# Patient Record
Sex: Female | Born: 1970 | Race: Black or African American | Hispanic: No | Marital: Married | Smoking: Never smoker
Health system: Southern US, Community
[De-identification: ages and names within clinical notes are randomized; demographics above are authoritative.]

## PROBLEM LIST (undated history)

## (undated) DIAGNOSIS — Z973 Presence of spectacles and contact lenses: Secondary | ICD-10-CM

## (undated) DIAGNOSIS — T8859XA Other complications of anesthesia, initial encounter: Secondary | ICD-10-CM

## (undated) DIAGNOSIS — Z800 Family history of malignant neoplasm of digestive organs: Secondary | ICD-10-CM

## (undated) DIAGNOSIS — F419 Anxiety disorder, unspecified: Secondary | ICD-10-CM

## (undated) DIAGNOSIS — F329 Major depressive disorder, single episode, unspecified: Secondary | ICD-10-CM

## (undated) DIAGNOSIS — E785 Hyperlipidemia, unspecified: Secondary | ICD-10-CM

## (undated) DIAGNOSIS — F32A Depression, unspecified: Secondary | ICD-10-CM

## (undated) DIAGNOSIS — I10 Essential (primary) hypertension: Secondary | ICD-10-CM

## (undated) HISTORY — DX: Depression, unspecified: F32.A

## (undated) HISTORY — DX: Anxiety disorder, unspecified: F41.9

## (undated) HISTORY — DX: Hyperlipidemia, unspecified: E78.5

## (undated) HISTORY — DX: Essential (primary) hypertension: I10

## (undated) HISTORY — DX: Major depressive disorder, single episode, unspecified: F32.9

---

## 2005-07-08 ENCOUNTER — Encounter: Admission: RE | Admit: 2005-07-08 | Discharge: 2005-07-08 | Payer: Self-pay | Admitting: Occupational Medicine

## 2005-11-10 DIAGNOSIS — O24419 Gestational diabetes mellitus in pregnancy, unspecified control: Secondary | ICD-10-CM

## 2005-11-10 HISTORY — DX: Gestational diabetes mellitus in pregnancy, unspecified control: O24.419

## 2006-01-12 ENCOUNTER — Encounter: Admission: RE | Admit: 2006-01-12 | Discharge: 2006-01-12 | Payer: Self-pay | Admitting: Obstetrics and Gynecology

## 2006-02-04 ENCOUNTER — Inpatient Hospital Stay (HOSPITAL_COMMUNITY): Admission: AD | Admit: 2006-02-04 | Discharge: 2006-02-04 | Payer: Self-pay | Admitting: Obstetrics and Gynecology

## 2006-03-22 ENCOUNTER — Inpatient Hospital Stay (HOSPITAL_COMMUNITY): Admission: AD | Admit: 2006-03-22 | Discharge: 2006-03-25 | Payer: Self-pay | Admitting: Obstetrics and Gynecology

## 2007-11-11 DIAGNOSIS — E079 Disorder of thyroid, unspecified: Secondary | ICD-10-CM

## 2007-11-11 HISTORY — PX: THYROIDECTOMY, PARTIAL: SHX18

## 2007-11-11 HISTORY — DX: Disorder of thyroid, unspecified: E07.9

## 2007-12-02 ENCOUNTER — Encounter (INDEPENDENT_AMBULATORY_CARE_PROVIDER_SITE_OTHER): Payer: Self-pay | Admitting: Diagnostic Radiology

## 2007-12-02 ENCOUNTER — Ambulatory Visit (HOSPITAL_COMMUNITY): Admission: RE | Admit: 2007-12-02 | Discharge: 2007-12-02 | Payer: Self-pay | Admitting: Otolaryngology

## 2008-02-10 ENCOUNTER — Encounter (INDEPENDENT_AMBULATORY_CARE_PROVIDER_SITE_OTHER): Payer: Self-pay | Admitting: Otolaryngology

## 2008-02-10 ENCOUNTER — Ambulatory Visit (HOSPITAL_COMMUNITY): Admission: RE | Admit: 2008-02-10 | Discharge: 2008-02-11 | Payer: Self-pay | Admitting: Otolaryngology

## 2008-04-02 IMAGING — US US BIOPSY
1 series · 14 of 16 positions shown · non-contrast
Comparison: none

CLINICAL DATA: 36-year-old female with complex nodule within the right thyroid.  Fine needle aspiration has been requested.
ULTRASOUND GUIDED FINE NEEDLE ASPIRATION RIGHT THYROID:
Written informed consent was obtained from the patient prior to the procedure.
Ultrasound was then performed to localize and mark an adequate site for aspiration.  The patient was then prepped and draped in a normal sterile fashion, 1% lidocaine was used for local anesthesia.  Using direct ultrasound guidance, a total of five passes were made using a 25-gauge hypodermic needle into multiple areas of this complex nodule.  Two of the aspirates yielded approximately 8 cc of thin brownish/green material from the cystic component.  Post-procedure imaging demonstrated no hematoma or immediate complication.  The patient tolerated the procedure well.

[Series 1: unknown · 0.09mm/px · 14 of 16 slices shown]
[im 1/16]
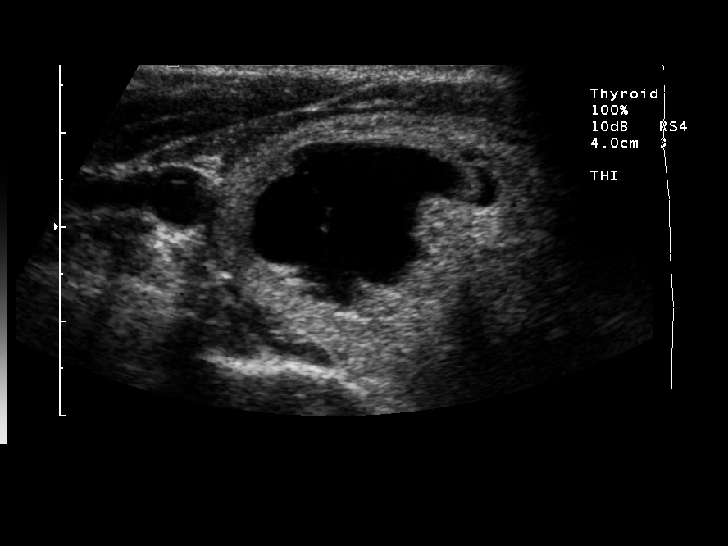
[im 2/16]
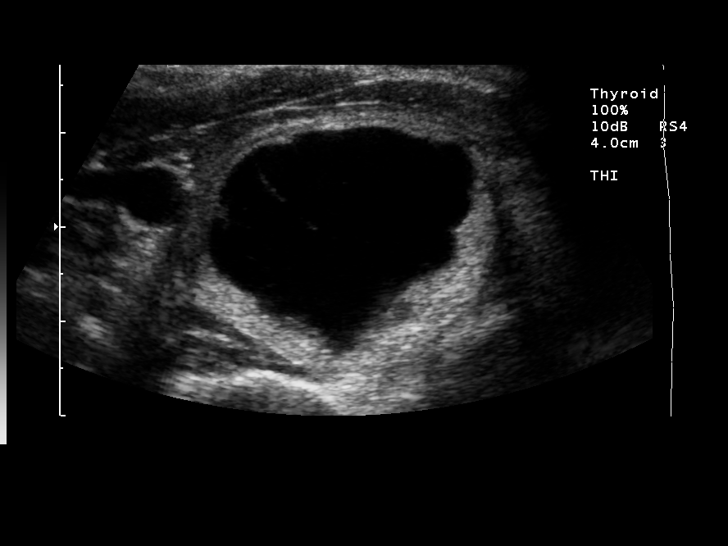
[im 3/16]
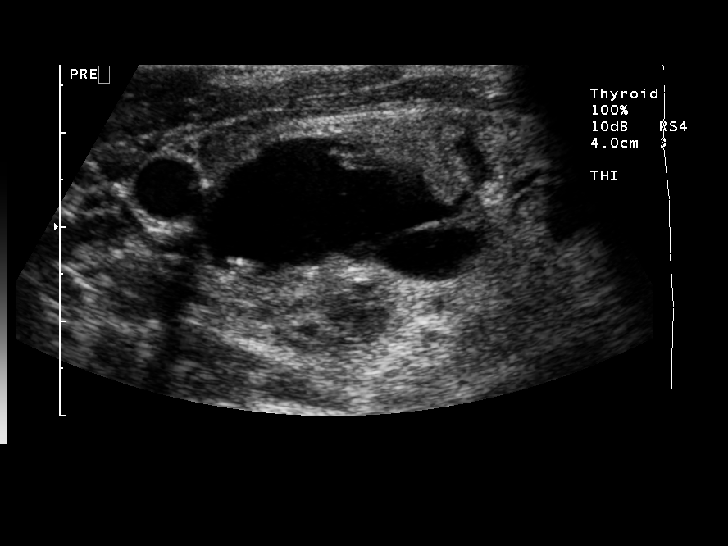
[im 5/16]
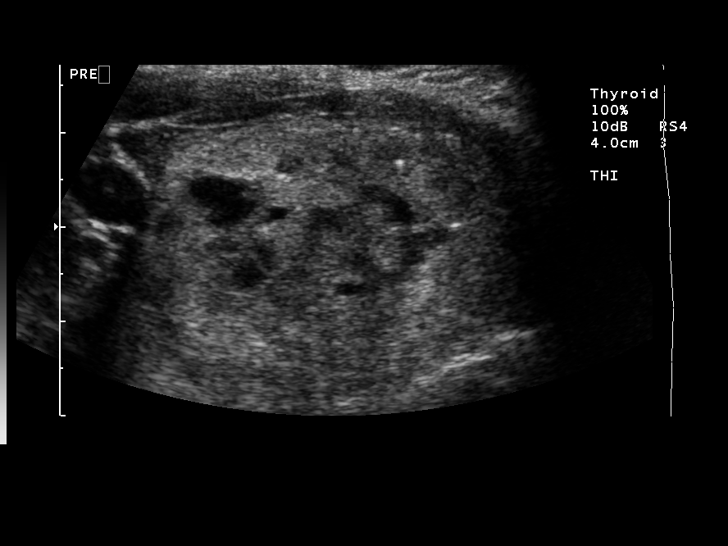
[im 6/16]
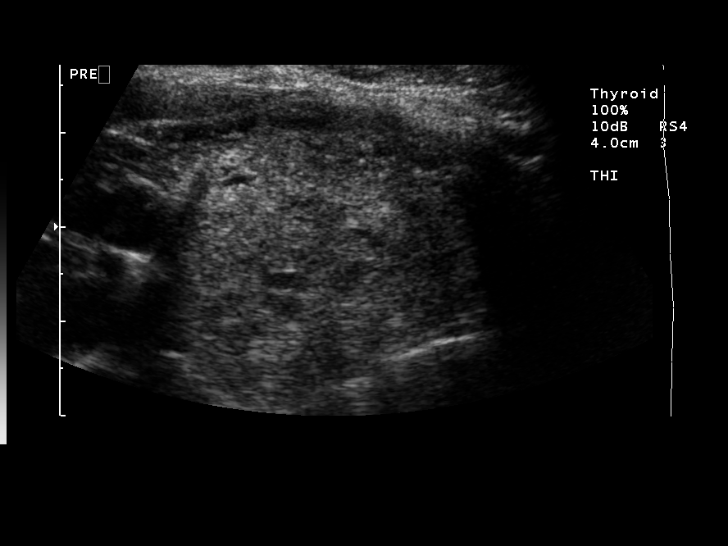
[im 7/16]
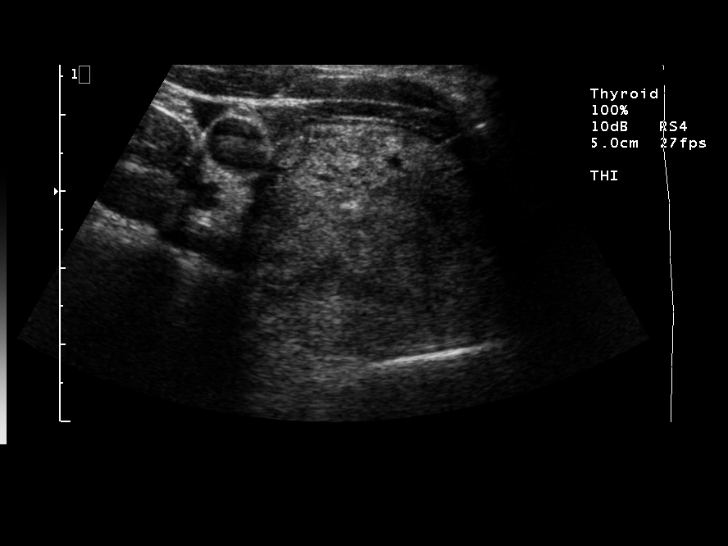
[im 8/16]
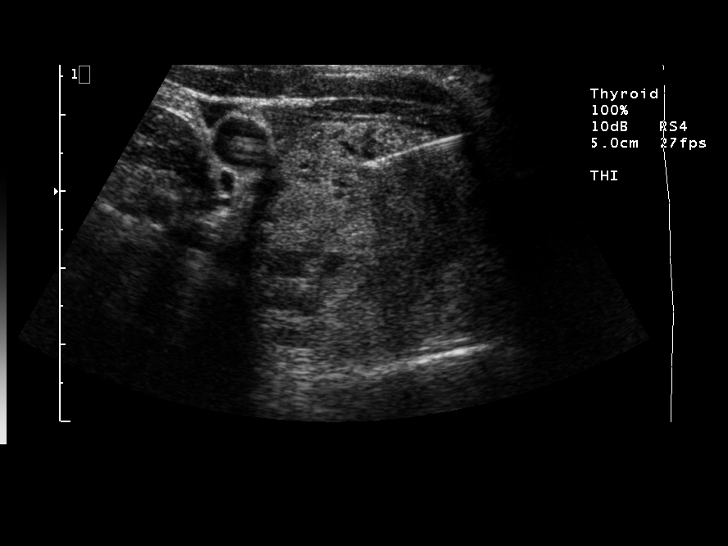
[im 9/16]
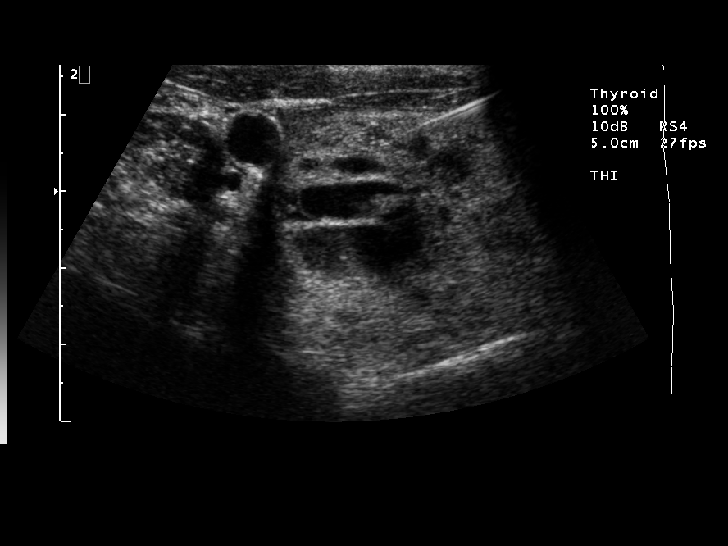
[im 10/16]
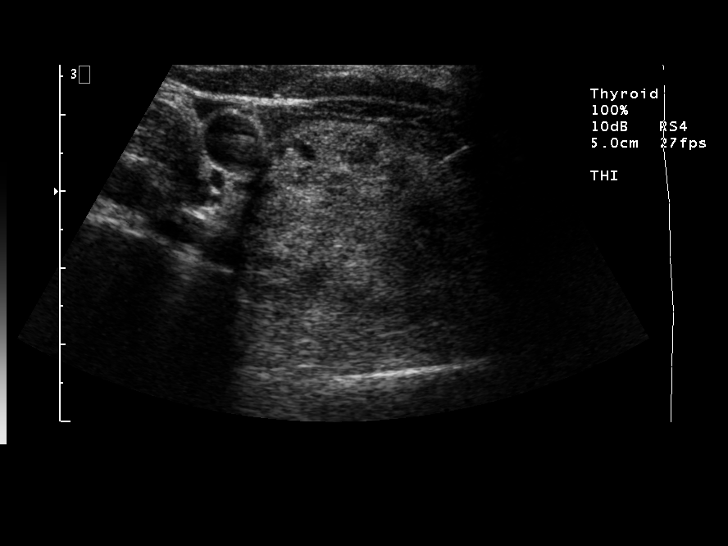
[im 11/16]
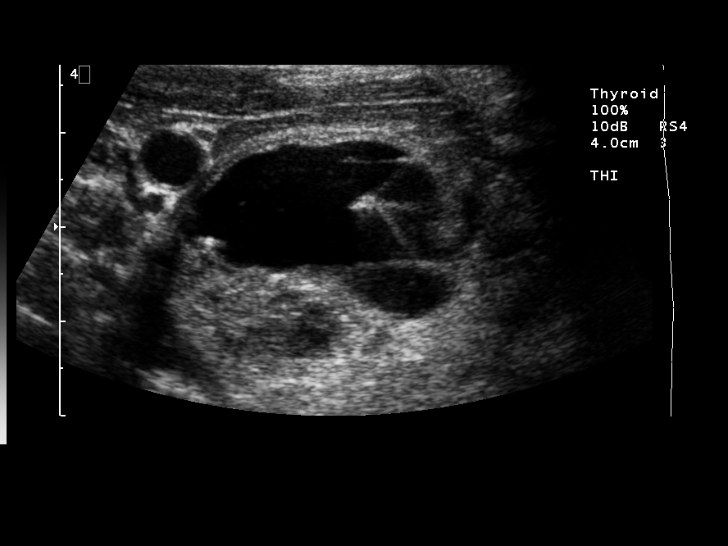
[im 13/16]
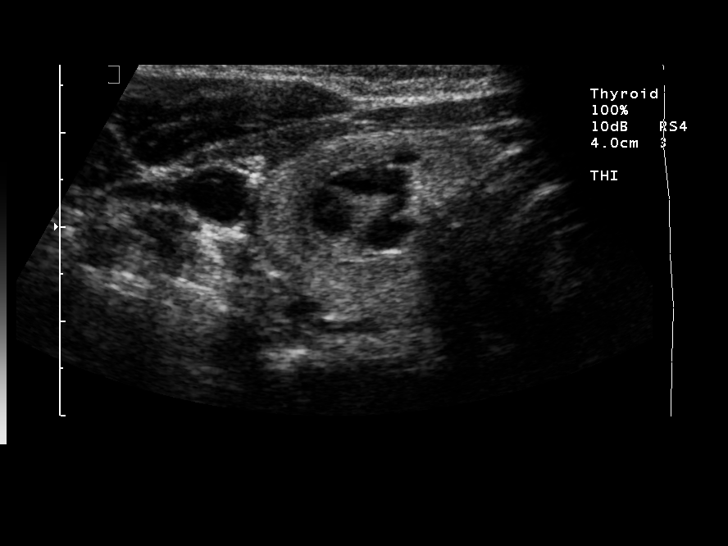
[im 14/16]
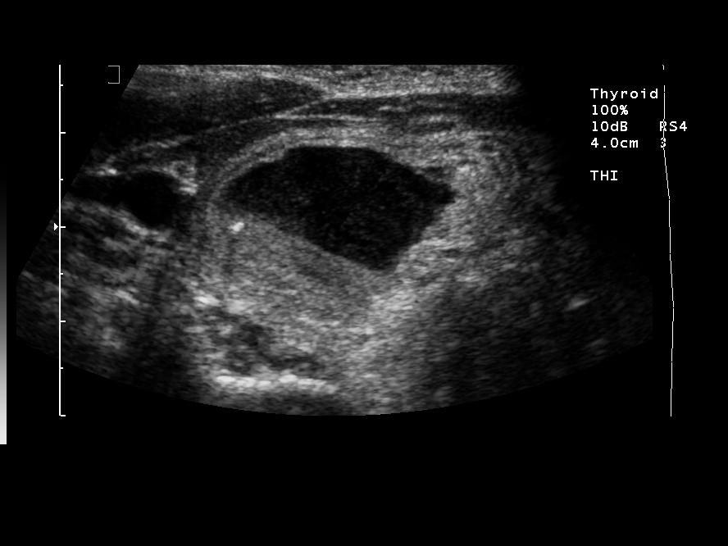
[im 15/16]
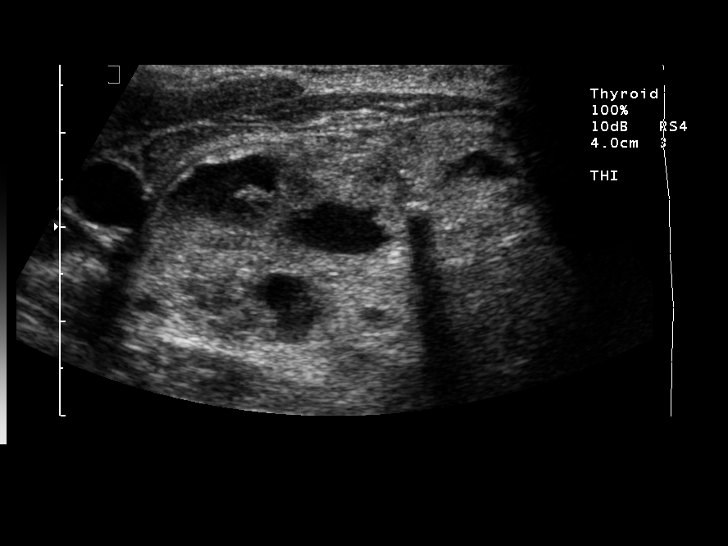
[im 16/16]
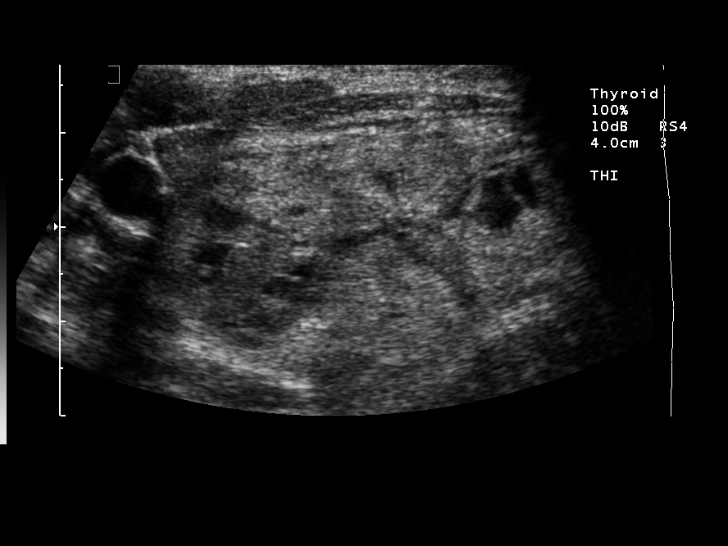

[14 of 16 positions shown; findings below may reference images not displayed]

IMPRESSION: Ultrasound -guided fine needle aspiration of complex nodule within the right thyroid.  Final pathology pending.

## 2008-06-04 IMAGING — CR DG CHEST 2V
2 series · 2 of 2 positions shown · non-contrast
Comparison: None

CLINICAL DATA: HYPERTENSION.

CHEST - 2 VIEW

[view not recorded (1 of 2)]
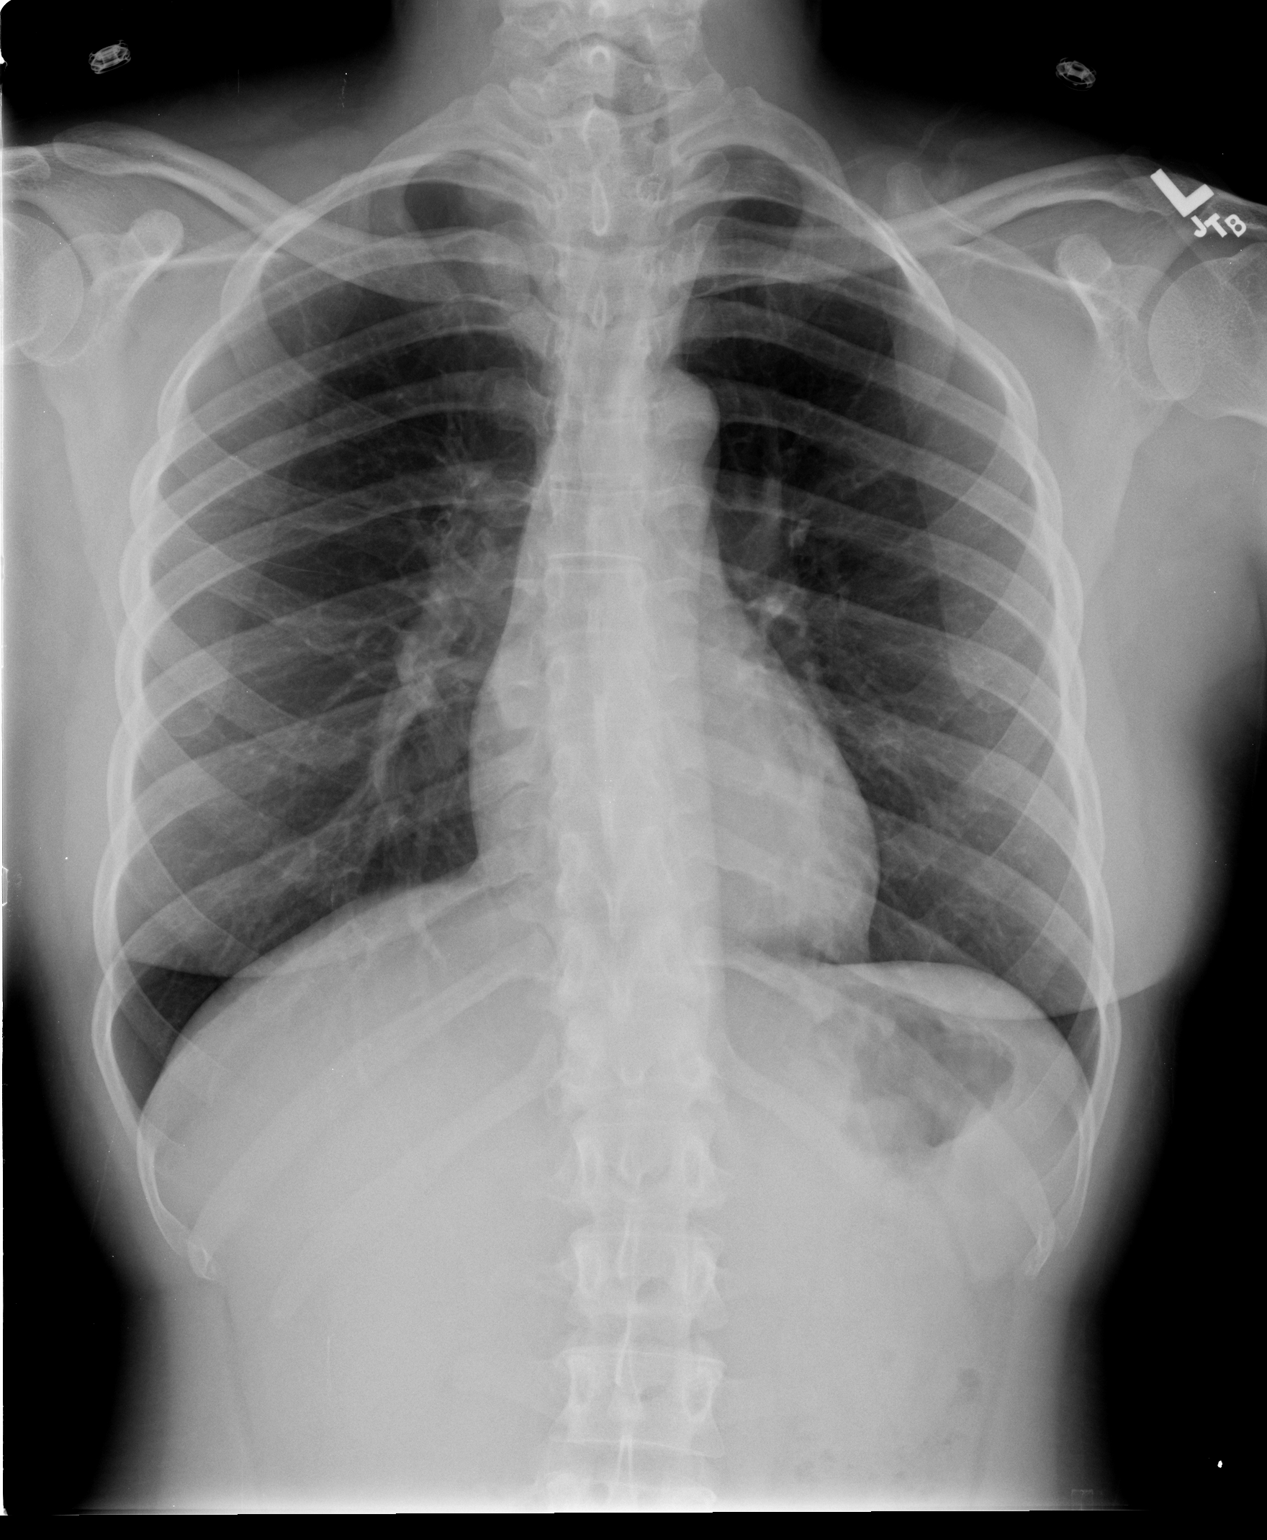

[view not recorded (2 of 2)]
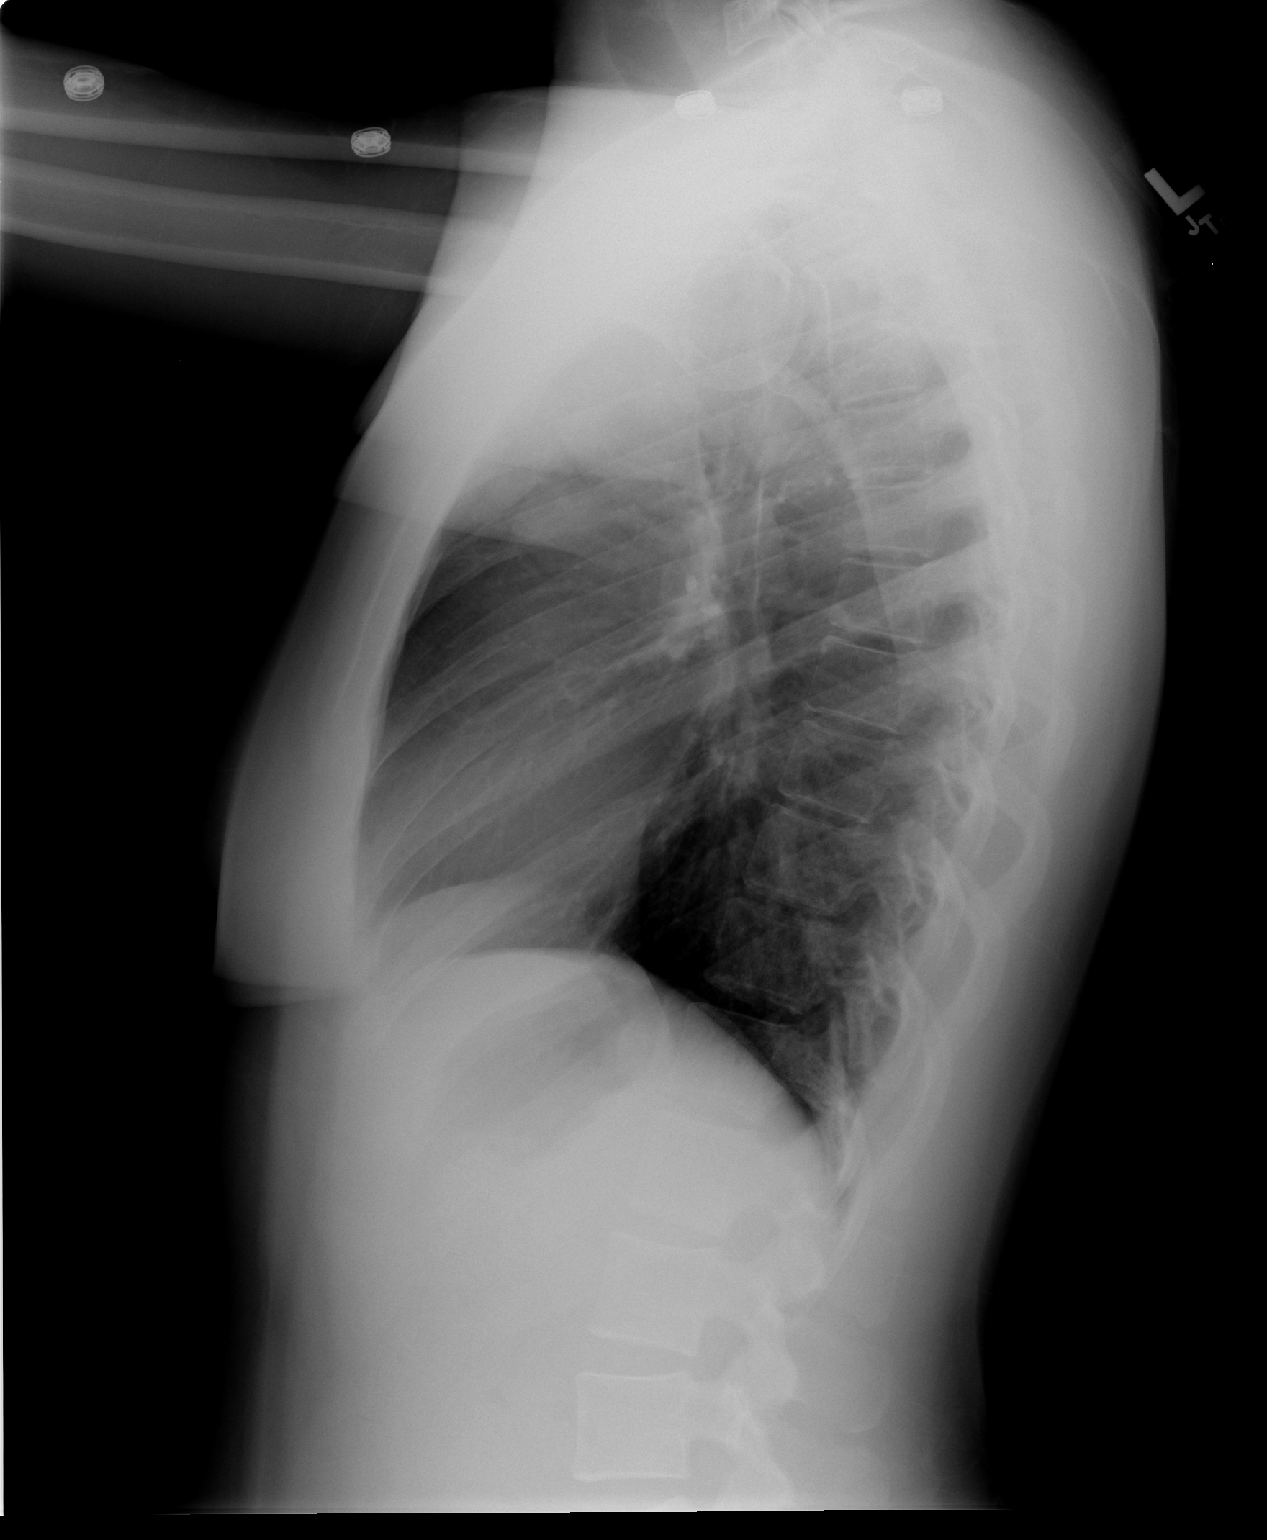

[2 of 2 positions shown; findings below may reference images not displayed]

FINDINGS: Cardiac density is normal in size.  Lungs are free of
infiltrates.  No pleural lesion is seen.  There is slight
scoliosis.
IMPRESSION: No acute findings.

## 2008-11-10 HISTORY — PX: OTHER SURGICAL HISTORY: SHX169

## 2011-03-25 NOTE — Op Note (Signed)
Amy [name], Amy [name]               ACCOUNT NO.:  [id number]   MEDICAL RECORD NO.:  [id number]          PATIENT TYPE:  AMB   LOCATION:  SDS                          FACILITY:  [facility]   PHYSICIAN:  Suzanna Obey, M.D.       DATE OF BIRTH:  01/26/1971   DATE OF PROCEDURE:  02/10/2008  DATE OF DISCHARGE:                               OPERATIVE REPORT   PREOPERATIVE DIAGNOSIS:  Right thyroid mass.   POSTOPERATIVE DIAGNOSIS:  Right thyroid mass.   SURGICAL PROCEDURE:  Right thyroidectomy.   ANESTHESIA:  General.   ESTIMATED BLOOD LOSS:  Approximately 25 cc.   INDICATIONS:  This is a 40 year old with a very large mass in the right  thyroid that was about 5 cm.  The procedure and options have been  discussed.  Risks and benefits were discussed.  All of her questions  were answered, and consent was obtained.   OPERATION:  Patient was taken to the operating room and placed in a  supine position after adequate general endotracheal tube anesthesia was  placed in the supine position.  Prepped and draped in the usual sterile  manner.  The shoulder roll was placed, and the skin incision was  performed, opening up the skin and dissecting a superior- and inferior-  based flap in the subplatysmal fashion.  The retractor was placed.  The  midline was identified, and the dissection was carried to the thyroid,  which was immediately very large and was dissected carefully along its  capital, carrying it out laterally, where the nerve was identified.  The  nerve was dissected into its insertion into the larynx and then  dissection was carried up over this, preserving the nerve.  One  parathyroid was identified, and it was preserved.  Berry ligament was  divided, and a specimen was sent for frozen section.  It was a  follicular lesion.  The patient was then irrigated, and a drain placed,  a #7.  Closed strap muscles with 4-0 chromic subcutaneous with 4-0  chromic and a running 5-0 nylon.  The patient was  awakened and brought  to recovery in stable condition.  Counts correct.           ______________________________  Suzanna Obey, M.D.     JB/MEDQ  D:  02/10/2008  T:  02/10/2008  Job:  [id number]

## 2011-08-04 LAB — BASIC METABOLIC PANEL
Calcium: 9.5
Chloride: 100
GFR calc Af Amer: >60
Potassium: 3.7

## 2011-08-04 LAB — CBC: WBC: 6.9

## 2014-01-09 ENCOUNTER — Other Ambulatory Visit: Payer: Self-pay

## 2014-01-09 DIAGNOSIS — Z1231 Encounter for screening mammogram for malignant neoplasm of breast: Secondary | ICD-10-CM

## 2014-01-26 ENCOUNTER — Ambulatory Visit: Payer: Self-pay

## 2014-01-31 ENCOUNTER — Ambulatory Visit: Payer: Federal, State, Local not specified - PPO

## 2014-02-21 ENCOUNTER — Ambulatory Visit (INDEPENDENT_AMBULATORY_CARE_PROVIDER_SITE_OTHER): Payer: Federal, State, Local not specified - PPO

## 2014-02-21 DIAGNOSIS — Z1231 Encounter for screening mammogram for malignant neoplasm of breast: Secondary | ICD-10-CM

## 2014-07-11 ENCOUNTER — Other Ambulatory Visit: Payer: Self-pay | Admitting: Obstetrics and Gynecology

## 2014-07-11 DIAGNOSIS — N63 Unspecified lump in unspecified breast: Secondary | ICD-10-CM

## 2014-07-18 ENCOUNTER — Ambulatory Visit
Admission: RE | Admit: 2014-07-18 | Discharge: 2014-07-18 | Disposition: A | Discharge status: Home / Self Care | Payer: Federal, State, Local not specified - PPO | Source: Ambulatory Visit | Attending: Obstetrics and Gynecology | Admitting: Obstetrics and Gynecology

## 2014-07-18 ENCOUNTER — Encounter (INDEPENDENT_AMBULATORY_CARE_PROVIDER_SITE_OTHER): Payer: Self-pay

## 2014-07-18 DIAGNOSIS — N63 Unspecified lump in unspecified breast: Secondary | ICD-10-CM

## 2017-12-16 ENCOUNTER — Encounter: Payer: Self-pay | Admitting: Gastroenterology

## 2018-01-29 ENCOUNTER — Ambulatory Visit: Payer: Federal, State, Local not specified - PPO | Admitting: Gastroenterology

## 2018-03-12 ENCOUNTER — Ambulatory Visit: Payer: Federal, State, Local not specified - PPO | Admitting: Internal Medicine

## 2018-06-11 ENCOUNTER — Ambulatory Visit: Payer: Federal, State, Local not specified - PPO | Admitting: Internal Medicine

## 2018-06-11 ENCOUNTER — Ambulatory Visit: Payer: Federal, State, Local not specified - PPO | Admitting: Nurse Practitioner

## 2018-06-11 ENCOUNTER — Encounter: Payer: Self-pay | Admitting: Nurse Practitioner

## 2018-06-11 VITALS — BP 110/74 | HR 65 | Ht 63.0 in | Wt 148.0 lb

## 2018-06-11 DIAGNOSIS — Z800 Family history of malignant neoplasm of digestive organs: Secondary | ICD-10-CM | POA: Diagnosis not present

## 2018-06-11 DIAGNOSIS — Z1211 Encounter for screening for malignant neoplasm of colon: Secondary | ICD-10-CM

## 2018-06-11 MED ORDER — NA SULFATE-K SULFATE-MG SULF 17.5-3.13-1.6 GM/177ML PO SOLN: ORAL | 0 refills | Status: DC

## 2018-06-11 NOTE — Patient Instructions (Signed)
If you are age 45 or older, your body mass index should be between 23-30. Your Body mass index is 26.22 kg/m. If this is out of the aforementioned range listed, please consider follow up with your Primary Care Provider.  If you are age 37 or younger, your body mass index should be between 19-25. Your Body mass index is 26.22 kg/m. If this is out of the aformentioned range listed, please consider follow up with your Primary Care Provider.   You have been scheduled for a colonoscopy. Please follow written instructions given to you at your visit today.  Please pick up your prep supplies at the pharmacy within the next 1-3 days. If you use inhalers (even only as needed), please bring them with you on the day of your procedure. Your physician has requested that you go to www.startemmi.com and enter the access code given to you at your visit today. This web site gives a general overview about your procedure. However, you should still follow specific instructions given to you by our office regarding your preparation for the procedure.  We have sent the following medications to your pharmacy for you to pick up at your convenience: Suprep  Thank you for choosing me and [facility].   Tye Savoy, NP

## 2018-06-11 NOTE — Progress Notes (Signed)
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 Mother had polyps, paternal grandmother had colon mass of unknown significance as she passed away from other causes. Patient's paternal GF and his brother both had colon cancer.  -It is reasonable to proceed with a screening colonoscopy . The risks and benefits of colonoscopy with possible polypectomy were discussed and the patient agrees to proceed. She comes in requesting Dr. Henrene Pastor to perform the procedure.       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 Her PCP has referred her for colonoscopy due to a strong family history of colon cancer. Ronnie has no GI complaints. Specifically she has no bowel changes, blood in stool, abdominal pain, weight loss, nor anemia. Her mother had colon polyps. Her maternal grandmother had a mass in her colon but passed away from something else before evaluation could be done. Her paternal grandfather and grandfather's brother both had colon cancer. She says her father didn't have primary colon cancer but mets to the colon.    Labs from PCP dawn 04/11/18 Normal CBC, normal serum chemistries.   Past Medical History:  Diagnosis Date  . Anxiety   . Depression   . Hyperlipidemia   . Hypertension     Past Surgical History:  Procedure Laterality Date  . throidectomy  2010   Family History  Problem Relation Age of Onset  . Colon polyps Mother   . Diverticulitis Mother   . Graves' disease Mother   . Colon cancer Father   . Lung cancer Father   . Diabetes Father   . Colon cancer Paternal Grandfather   . Colon cancer Paternal Uncle   . Liver disease Paternal Uncle   . Kidney cancer Maternal Uncle   . Heart disease Paternal Grandmother    Social History    Tobacco Use  . Smoking status: Never Smoker  . Smokeless tobacco: Never Used  Substance Use Topics  . Alcohol use: Not on file    Comment: social   . Drug use: Never   Current Outpatient Medications  Medication Sig Dispense Refill  . amLODipine (NORVASC) 5 MG tablet Take 5 mg by mouth daily.    . hydrochlorothiazide (HYDRODIURIL) 25 MG tablet Take 25 mg by mouth daily.    . norethindrone (MICRONOR,CAMILA,ERRIN) 0.35 MG tablet Take 1 tablet by mouth daily.     No current facility-administered medications for this visit.    Allergies  Allergen Reactions  . Latex Rash     Review of Systems: All systems reviewed and negative except where noted in HPI.   Creatinine clearance cannot be calculated (Patient's most recent lab result is older than the maximum 21 days allowed.)   Physical Exam:    Wt Readings from Last 3 Encounters:  06/11/18 148 lb (67.1 kg)    BP 110/74   Pulse 65   Ht 5\' 3"  (1.6 m)   Wt 148 lb (67.1 kg)   BMI 26.22 kg/m  Constitutional:  Pleasant female in no acute distress. Psychiatric: Normal mood and affect. Behavior is normal. EENT: Pupils normal.  Conjunctivae are normal. No scleral icterus. Neck supple.  Cardiovascular:  Normal rate, regular rhythm. No edema Pulmonary/chest: Effort normal and breath sounds normal. No wheezing, rales or rhonchi. Abdominal: Soft, nondistended, nontender. Bowel sounds active throughout. There are no masses palpable. No hepatomegaly. Neurological: Alert and oriented to person place and time. Skin: Skin is warm and dry. No rashes noted.  Tye Savoy, NP  06/11/2018, 3:31 PM   Cc:  Gar Ponto, MD

## 2018-06-13 ENCOUNTER — Encounter: Payer: Self-pay | Admitting: Nurse Practitioner

## 2018-06-14 NOTE — Progress Notes (Signed)
Assessment and plans reviewed  

## 2018-07-28 ENCOUNTER — Encounter: Payer: Self-pay | Admitting: Internal Medicine

## 2018-08-09 ENCOUNTER — Encounter: Payer: Federal, State, Local not specified - PPO | Admitting: Internal Medicine

## 2018-12-09 ENCOUNTER — Other Ambulatory Visit (HOSPITAL_COMMUNITY): Payer: Self-pay | Admitting: Family Medicine

## 2018-12-09 ENCOUNTER — Other Ambulatory Visit: Payer: Self-pay | Admitting: Family Medicine

## 2018-12-09 DIAGNOSIS — I119 Hypertensive heart disease without heart failure: Secondary | ICD-10-CM

## 2018-12-10 ENCOUNTER — Other Ambulatory Visit (HOSPITAL_COMMUNITY): Payer: Federal, State, Local not specified - PPO

## 2018-12-14 ENCOUNTER — Ambulatory Visit (HOSPITAL_COMMUNITY): Payer: Federal, State, Local not specified - PPO | Attending: Cardiovascular Disease

## 2018-12-14 DIAGNOSIS — I119 Hypertensive heart disease without heart failure: Secondary | ICD-10-CM | POA: Diagnosis present

## 2020-02-09 ENCOUNTER — Encounter: Payer: Self-pay | Admitting: Internal Medicine

## 2020-03-07 ENCOUNTER — Other Ambulatory Visit: Payer: Self-pay

## 2020-03-07 ENCOUNTER — Ambulatory Visit (AMBULATORY_SURGERY_CENTER): Payer: Self-pay

## 2020-03-07 VITALS — Temp 98.1°F | Ht 63.0 in | Wt 144.6 lb

## 2020-03-07 DIAGNOSIS — Z800 Family history of malignant neoplasm of digestive organs: Secondary | ICD-10-CM

## 2020-03-07 DIAGNOSIS — Z1211 Encounter for screening for malignant neoplasm of colon: Secondary | ICD-10-CM

## 2020-03-07 MED ORDER — NA SULFATE-K SULFATE-MG SULF 17.5-3.13-1.6 GM/177ML PO SOLN: 1.0000 | Freq: Once | ORAL | 0 refills | Status: AC

## 2020-03-07 NOTE — Progress Notes (Signed)
No allergies to soy or egg Pt is not on blood thinners or diet pills Denies issues with sedation/intubation Denies atrial flutter/fib Denies constipation   Pt is aware of Covid safety and care partner requirements.      

## 2020-03-21 ENCOUNTER — Other Ambulatory Visit: Payer: Self-pay

## 2020-03-21 ENCOUNTER — Ambulatory Visit (AMBULATORY_SURGERY_CENTER): Payer: Federal, State, Local not specified - PPO | Admitting: Internal Medicine

## 2020-03-21 ENCOUNTER — Encounter: Payer: Self-pay | Admitting: Internal Medicine

## 2020-03-21 VITALS — BP 126/75 | HR 56 | Temp 95.9°F | Resp 17 | Ht 63.0 in | Wt 144.6 lb

## 2020-03-21 DIAGNOSIS — D122 Benign neoplasm of ascending colon: Secondary | ICD-10-CM | POA: Diagnosis not present

## 2020-03-21 DIAGNOSIS — Z1211 Encounter for screening for malignant neoplasm of colon: Secondary | ICD-10-CM

## 2020-03-21 DIAGNOSIS — Z800 Family history of malignant neoplasm of digestive organs: Secondary | ICD-10-CM | POA: Diagnosis present

## 2020-03-21 HISTORY — PX: COLONOSCOPY: SHX174

## 2020-03-21 MED ORDER — SODIUM CHLORIDE 0.9 % IV SOLN: 500.0000 mL | Freq: Once | INTRAVENOUS | Status: DC

## 2020-03-21 NOTE — Progress Notes (Signed)
Pt's states no medical or surgical changes since previsit or office visit.   TEMP-LS  V/S-DT

## 2020-03-21 NOTE — Patient Instructions (Signed)

## 2020-03-21 NOTE — Progress Notes (Signed)
Report to PACU, RN, vss, BBS= Clear.  

## 2020-03-21 NOTE — Progress Notes (Signed)
Called to room to assist during endoscopic procedure.  Patient ID and intended procedure confirmed with present staff. Received instructions for my participation in the procedure from the performing physician.  

## 2020-03-23 ENCOUNTER — Telehealth: Payer: Self-pay | Admitting: *Deleted

## 2020-03-23 NOTE — Telephone Encounter (Signed)
  Follow up Call-  Call back number 03/21/2020  Post procedure Call Back phone  # [phone]  Permission to leave phone message Yes  Some recent data might be hidden     Patient questions:  Do you have a fever, pain , or abdominal swelling? No. Pain Score  0 *  Have you tolerated food without any problems? Yes.    Have you been able to return to your normal activities? Yes.    Do you have any questions about your discharge instructions: Diet   No. Medications  No. Follow up visit  No.  Do you have questions or concerns about your Care? No.  Actions: * If pain score is 4 or above: No action needed, pain <4.  1. Have you developed a fever since your procedure? no  2.   Have you had an respiratory symptoms (SOB or cough) since your procedure? no  3.   Have you tested positive for COVID 19 since your procedure no  4.   Have you had any family members/close contacts diagnosed with the COVID 19 since your procedure?  no   If yes to any of these questions please route to Joylene John, RN and Erenest Rasher, RN

## 2020-03-26 ENCOUNTER — Encounter: Payer: Self-pay | Admitting: Internal Medicine

## 2022-04-12 ENCOUNTER — Other Ambulatory Visit: Payer: Self-pay | Admitting: Obstetrics and Gynecology

## 2022-05-08 ENCOUNTER — Encounter (HOSPITAL_BASED_OUTPATIENT_CLINIC_OR_DEPARTMENT_OTHER): Payer: Self-pay | Admitting: Obstetrics and Gynecology

## 2022-05-08 ENCOUNTER — Other Ambulatory Visit: Payer: Self-pay

## 2022-05-08 NOTE — Progress Notes (Addendum)
Spoke w/ via phone for pre-op interview---Amy [name] needs dos----  urine pregnancy             [name] results------05/19/22 [name] appt for cbc, type & screen, bmp, ekg, 12/14/2018 Echocardiogram EF 55 - 60% COVID test -----patient states asymptomatic no test needed Arrive at -------0530 on Friday, 05/23/22 NPO after MN NO Solid Food.  Clear liquids from MN until---0430 Med rec completed Medications to take morning of surgery -----Amlodipine, Micronor, Valtrex prn Diabetic medication -----n/a Patient instructed no nail polish to be worn day of surgery Patient instructed to bring photo id and insurance card day of surgery Patient aware to have Driver (ride ) / caregiver    for 24 hours after surgery - sister, [name] Patient Special Instructions -----Overnight stay instructions given. Pre-Op special Istructions -----none Patient verbalized understanding of instructions that were given at this phone interview. Patient denies shortness of breath, chest pain, fever, cough at this phone interview.

## 2022-05-08 NOTE — Progress Notes (Signed)
Your procedure is scheduled on Friday, 05/23/22.  Report to [facility].   Call this number if you have problems the morning of surgery  :[phone].   OUR ADDRESS IS [address] [place].  WE ARE LOCATED IN [facility].  PLEASE BRING YOUR INSURANCE CARD AND PHOTO ID DAY OF SURGERY.  ONLY 2 PEOPLE ARE ALLOWED IN  WAITING  ROOM.                                      REMEMBER:  DO NOT EAT FOOD, CANDY GUM OR MINTS  AFTER MIDNIGHT THE NIGHT BEFORE YOUR SURGERY . YOU MAY HAVE CLEAR LIQUIDS FROM MIDNIGHT THE NIGHT BEFORE YOUR SURGERY UNTIL  4:30 AM. NO CLEAR LIQUIDS AFTER   4:30 AM DAY OF SURGERY.  YOU MAY  BRUSH YOUR TEETH MORNING OF SURGERY AND RINSE YOUR MOUTH OUT, NO CHEWING GUM CANDY OR MINTS.     CLEAR LIQUID DIET   Foods Allowed                                                                     Foods Excluded  Coffee and tea, regular and decaf                             liquids that you cannot  Plain Jell-O                                                                   see through such as: Fruit ices (not with fruit pulp)                                     milk, soups, orange juice  Plain  Popsicles                                    All solid food Carbonated beverages, regular and diet                                    Cranberry, grape and apple juices Sports drinks like Gatorade _____________________________________________________________________     TAKE THESE MEDICATIONS MORNING OF SURGERY: Amlodipine, Micronor, Valtrex if needed    UP TO 4 VISITORS  MAY VISIT IN THE EXTENDED RECOVERY ROOM UNTIL 800 PM ONLY.  ONE  VISITOR AGE 51 AND OVER MAY SPEND THE NIGHT AND MUST BE IN EXTENDED RECOVERY ROOM NO LATER THAN 800 PM . YOUR DISCHARGE TIME AFTER YOU SPEND THE NIGHT IS 900 AM THE MORNING AFTER YOUR SURGERY.  YOU MAY PACK A SMALL OVERNIGHT BAG WITH TOILETRIES FOR  YOUR OVERNIGHT STAY IF YOU WISH.  YOUR PRESCRIPTION  MEDICATIONS WILL BE PROVIDED DURING [place].                                      DO NOT WEAR JEWERLY, MAKE UP. DO NOT WEAR LOTIONS, POWDERS, PERFUMES OR NAIL POLISH ON YOUR FINGERNAILS. TOENAIL POLISH IS OK TO WEAR. DO NOT SHAVE FOR 48 HOURS PRIOR TO DAY OF SURGERY. MEN MAY SHAVE FACE AND NECK. CONTACTS, GLASSES, OR DENTURES MAY NOT BE WORN TO SURGERY.  REMEMBER: NO SMOKING, DRUGS OR ALCOHOL FOR 24 HOURS BEFORE YOUR SURGERY.                                    [facility] IS NOT RESPONSIBLE  FOR ANY BELONGINGS.                                                                    Marland Kitchen           [facility] - Preparing for Surgery Before surgery, you can play an important role.  Because skin is not sterile, your skin needs to be as free of germs as possible.  You can reduce the number of germs on your skin by washing with CHG (chlorahexidine gluconate) soap before surgery.  CHG is an antiseptic cleaner which kills germs and bonds with the skin to continue killing germs even after washing. Please DO NOT use if you have an allergy to CHG or antibacterial soaps.  If your skin becomes reddened/irritated stop using the CHG and inform your nurse when you arrive at Short Stay. Do not shave (including legs and underarms) for at least 48 hours prior to the first CHG shower.  You may shave your face/neck. Please follow these instructions carefully:  1.  Shower with CHG Soap the night before surgery and the  morning of Surgery.  2.  If you choose to wash your hair, wash your hair first as usual with your  normal  shampoo.  3.  After you shampoo, rinse your hair and body thoroughly to remove the  shampoo.                            4.  Use CHG as you would any other liquid soap.  You can apply chg directly  to the skin and wash , please wash your belly button thoroughly with chg soap provided night before and morning of your surgery.                     Gently with a scrungie or clean washcloth.  5.   Apply the CHG Soap to your body ONLY FROM THE NECK DOWN.   Do not use on face/ open                           Wound or open sores. Avoid contact with eyes, ears mouth and genitals (private parts).  Wash face,  Genitals (private parts) with your normal soap.             6.  Wash thoroughly, paying special attention to the area where your surgery  will be performed.  7.  Thoroughly rinse your body with warm water from the neck down.  8.  DO NOT shower/wash with your normal soap after using and rinsing off  the CHG Soap.                9.  Pat yourself dry with a clean towel.            10.  Wear clean pajamas.            11.  Place clean sheets on your bed the night of your first shower and do not  sleep with pets. Day of Surgery : Do not apply any lotions/deodorants the morning of surgery.  Please wear clean clothes to the hospital/surgery center.  IF YOU HAVE ANY SKIN IRRITATION OR PROBLEMS WITH THE SURGICAL SOAP, PLEASE GET A BAR OF GOLD DIAL SOAP AND SHOWER THE NIGHT BEFORE YOUR SURGERY AND THE MORNING OF YOUR SURGERY. PLEASE LET THE NURSE KNOW MORNING OF YOUR SURGERY IF YOU HAD ANY PROBLEMS WITH THE SURGICAL SOAP.   ________________________________________________________________________                                                        QUESTIONS [facility] PRE OP NURSE PHONE [phone].

## 2022-05-19 ENCOUNTER — Encounter (HOSPITAL_COMMUNITY)
Admission: RE | Admit: 2022-05-19 | Discharge: 2022-05-19 | Disposition: A | Discharge status: Home / Self Care | Payer: Federal, State, Local not specified - PPO | Source: Ambulatory Visit | Attending: Obstetrics and Gynecology | Admitting: Obstetrics and Gynecology

## 2022-05-19 DIAGNOSIS — Z01818 Encounter for other preprocedural examination: Secondary | ICD-10-CM

## 2022-05-19 LAB — BASIC METABOLIC PANEL
Anion gap: 8 (ref 5–15)
BUN: 12 mg/dL (ref 6–20)
CO2: 28 mmol/L (ref 22–32)
Calcium: 9.1 mg/dL (ref 8.9–10.3)
Chloride: 102 mmol/L (ref 98–111)
Creatinine, Ser: 0.85 mg/dL (ref 0.44–1.00)
GFR, Estimated: >60 mL/min (ref >60)
Glucose, Bld: 145 mg/dL — ABNORMAL HIGH (ref 70–99)
Potassium: 3.3 mmol/L — ABNORMAL LOW (ref 3.5–5.1)
Sodium: 138 mmol/L (ref 135–145)

## 2022-05-19 LAB — CBC
HCT: 43 % (ref 36.0–46.0)
Hemoglobin: 14.7 g/dL (ref 12.0–15.0)
MCH: 31 pg (ref 26.0–34.0)
MCHC: 34.2 g/dL (ref 30.0–36.0)
MCV: 90.7 fL (ref 80.0–100.0)
Platelets: 186 10*3/uL (ref 150–400)
RBC: 4.74 MIL/uL (ref 3.87–5.11)
RDW: 13.1 % (ref 11.5–15.5)
WBC: 5.9 10*3/uL (ref 4.0–10.5)
nRBC: 0 % (ref 0.0–0.2)

## 2022-05-23 ENCOUNTER — Encounter (HOSPITAL_BASED_OUTPATIENT_CLINIC_OR_DEPARTMENT_OTHER): Payer: Self-pay | Admitting: Obstetrics and Gynecology

## 2022-05-23 ENCOUNTER — Ambulatory Visit (HOSPITAL_BASED_OUTPATIENT_CLINIC_OR_DEPARTMENT_OTHER)
Admission: RE | Admit: 2022-05-23 | Discharge: 2022-05-24 | Disposition: A | Discharge status: Home / Self Care | Payer: Federal, State, Local not specified - PPO | Source: Ambulatory Visit | Attending: Obstetrics and Gynecology | Admitting: Obstetrics and Gynecology

## 2022-05-23 ENCOUNTER — Ambulatory Visit (HOSPITAL_BASED_OUTPATIENT_CLINIC_OR_DEPARTMENT_OTHER): Payer: Federal, State, Local not specified - PPO | Admitting: Anesthesiology

## 2022-05-23 ENCOUNTER — Other Ambulatory Visit: Payer: Self-pay

## 2022-05-23 ENCOUNTER — Encounter (HOSPITAL_BASED_OUTPATIENT_CLINIC_OR_DEPARTMENT_OTHER)
Admission: RE | Disposition: A | Discharge status: Home / Self Care | Payer: Self-pay | Source: Ambulatory Visit | Attending: Obstetrics and Gynecology

## 2022-05-23 DIAGNOSIS — N888 Other specified noninflammatory disorders of cervix uteri: Secondary | ICD-10-CM | POA: Diagnosis not present

## 2022-05-23 DIAGNOSIS — E119 Type 2 diabetes mellitus without complications: Secondary | ICD-10-CM | POA: Insufficient documentation

## 2022-05-23 DIAGNOSIS — D252 Subserosal leiomyoma of uterus: Secondary | ICD-10-CM | POA: Diagnosis not present

## 2022-05-23 DIAGNOSIS — Z90710 Acquired absence of both cervix and uterus: Secondary | ICD-10-CM | POA: Diagnosis present

## 2022-05-23 DIAGNOSIS — N946 Dysmenorrhea, unspecified: Secondary | ICD-10-CM | POA: Diagnosis not present

## 2022-05-23 DIAGNOSIS — N8302 Follicular cyst of left ovary: Secondary | ICD-10-CM | POA: Insufficient documentation

## 2022-05-23 DIAGNOSIS — Z79899 Other long term (current) drug therapy: Secondary | ICD-10-CM | POA: Insufficient documentation

## 2022-05-23 DIAGNOSIS — I10 Essential (primary) hypertension: Secondary | ICD-10-CM | POA: Diagnosis not present

## 2022-05-23 DIAGNOSIS — Z01818 Encounter for other preprocedural examination: Secondary | ICD-10-CM

## 2022-05-23 DIAGNOSIS — N920 Excessive and frequent menstruation with regular cycle: Secondary | ICD-10-CM | POA: Diagnosis present

## 2022-05-23 HISTORY — DX: Presence of spectacles and contact lenses: Z97.3

## 2022-05-23 HISTORY — PX: ROBOTIC ASSISTED LAPAROSCOPIC HYSTERECTOMY AND SALPINGECTOMY: SHX6379

## 2022-05-23 HISTORY — DX: Family history of malignant neoplasm of digestive organs: Z80.0

## 2022-05-23 HISTORY — DX: Other complications of anesthesia, initial encounter: T88.59XA

## 2022-05-23 LAB — TYPE AND SCREEN
ABO/RH(D): O POS
Antibody Screen: NEGATIVE

## 2022-05-23 LAB — POCT PREGNANCY, URINE: Preg Test, Ur: NEGATIVE

## 2022-05-23 LAB — ABO/RH: ABO/RH(D): O POS

## 2022-05-23 SURGERY — XI ROBOTIC ASSISTED LAPAROSCOPIC HYSTERECTOMY AND SALPINGECTOMY
Anesthesia: General | Site: Pelvis

## 2022-05-23 MED ORDER — MIDAZOLAM HCL 2 MG/2ML IJ SOLN
INTRAMUSCULAR | Status: AC
  Filled 2022-05-23: qty 2

## 2022-05-23 MED ORDER — MEPERIDINE HCL 25 MG/ML IJ SOLN: 6.2500 mg | INTRAMUSCULAR | Status: DC | PRN

## 2022-05-23 MED ORDER — KETOROLAC TROMETHAMINE 30 MG/ML IJ SOLN
INTRAMUSCULAR | Status: DC | PRN
  Administered 2022-05-23: 30 mg via INTRAVENOUS

## 2022-05-23 MED ORDER — AMLODIPINE BESYLATE 5 MG PO TABS
5.0000 mg | ORAL_TABLET | Freq: Every day | ORAL | Status: DC
  Filled 2022-05-23: qty 1

## 2022-05-23 MED ORDER — ROCURONIUM BROMIDE 10 MG/ML (PF) SYRINGE
PREFILLED_SYRINGE | INTRAVENOUS | Status: AC
  Filled 2022-05-23: qty 10

## 2022-05-23 MED ORDER — PANTOPRAZOLE SODIUM 40 MG PO TBEC
DELAYED_RELEASE_TABLET | ORAL | Status: AC
  Filled 2022-05-23: qty 1

## 2022-05-23 MED ORDER — KETOROLAC TROMETHAMINE 30 MG/ML IJ SOLN
30.0000 mg | Freq: Four times a day (QID) | INTRAMUSCULAR | Status: DC
  Administered 2022-05-23 – 2022-05-24 (×3): 30 mg via INTRAVENOUS

## 2022-05-23 MED ORDER — PROPOFOL 10 MG/ML IV BOLUS
INTRAVENOUS | Status: AC
  Filled 2022-05-23: qty 20

## 2022-05-23 MED ORDER — LIDOCAINE 2% (20 MG/ML) 5 ML SYRINGE
INTRAMUSCULAR | Status: DC | PRN
  Administered 2022-05-23: 60 mg via INTRAVENOUS

## 2022-05-23 MED ORDER — ACETAMINOPHEN 10 MG/ML IV SOLN
INTRAVENOUS | Status: DC | PRN
  Administered 2022-05-23: 1000 mg via INTRAVENOUS

## 2022-05-23 MED ORDER — SUGAMMADEX SODIUM 200 MG/2ML IV SOLN
INTRAVENOUS | Status: DC | PRN
  Administered 2022-05-23: 200 mg via INTRAVENOUS

## 2022-05-23 MED ORDER — HEMOSTATIC AGENTS (NO CHARGE) OPTIME
TOPICAL | Status: DC | PRN
  Administered 2022-05-23: 1

## 2022-05-23 MED ORDER — ONDANSETRON HCL 4 MG/2ML IJ SOLN
INTRAMUSCULAR | Status: DC | PRN
  Administered 2022-05-23: 4 mg via INTRAVENOUS

## 2022-05-23 MED ORDER — DEXAMETHASONE SODIUM PHOSPHATE 10 MG/ML IJ SOLN
INTRAMUSCULAR | Status: DC | PRN
  Administered 2022-05-23: 5 mg via INTRAVENOUS

## 2022-05-23 MED ORDER — BUPIVACAINE HCL (PF) 0.25 % IJ SOLN
INTRAMUSCULAR | Status: DC | PRN
  Administered 2022-05-23: 10 mL

## 2022-05-23 MED ORDER — FENTANYL CITRATE (PF) 100 MCG/2ML IJ SOLN
INTRAMUSCULAR | Status: AC
  Filled 2022-05-23: qty 2

## 2022-05-23 MED ORDER — PHENYLEPHRINE 80 MCG/ML (10ML) SYRINGE FOR IV PUSH (FOR BLOOD PRESSURE SUPPORT)
PREFILLED_SYRINGE | INTRAVENOUS | Status: DC | PRN
  Administered 2022-05-23 (×2): 80 ug via INTRAVENOUS

## 2022-05-23 MED ORDER — WHITE PETROLATUM EX OINT
TOPICAL_OINTMENT | CUTANEOUS | Status: AC
  Filled 2022-05-23: qty 5

## 2022-05-23 MED ORDER — LIDOCAINE HCL (PF) 2 % IJ SOLN
INTRAMUSCULAR | Status: AC
  Filled 2022-05-23: qty 10

## 2022-05-23 MED ORDER — AMISULPRIDE (ANTIEMETIC) 5 MG/2ML IV SOLN: 10.0000 mg | Freq: Once | INTRAVENOUS | Status: DC | PRN

## 2022-05-23 MED ORDER — ACETAMINOPHEN 10 MG/ML IV SOLN
INTRAVENOUS | Status: AC
  Filled 2022-05-23: qty 100

## 2022-05-23 MED ORDER — STERILE WATER FOR IRRIGATION IR SOLN
Status: DC | PRN
  Administered 2022-05-23: 500 mL

## 2022-05-23 MED ORDER — KETOROLAC TROMETHAMINE 30 MG/ML IJ SOLN
INTRAMUSCULAR | Status: AC
  Filled 2022-05-23: qty 1

## 2022-05-23 MED ORDER — OXYCODONE HCL 5 MG PO TABS: 5.0000 mg | ORAL_TABLET | Freq: Once | ORAL | Status: DC | PRN

## 2022-05-23 MED ORDER — ROCURONIUM BROMIDE 10 MG/ML (PF) SYRINGE
PREFILLED_SYRINGE | INTRAVENOUS | Status: DC | PRN
  Administered 2022-05-23: 70 mg via INTRAVENOUS
  Administered 2022-05-23: 10 mg via INTRAVENOUS

## 2022-05-23 MED ORDER — FENTANYL CITRATE (PF) 100 MCG/2ML IJ SOLN
INTRAMUSCULAR | Status: DC | PRN
  Administered 2022-05-23 (×2): 50 ug via INTRAVENOUS

## 2022-05-23 MED ORDER — ONDANSETRON HCL 4 MG/2ML IJ SOLN
4.0000 mg | Freq: Four times a day (QID) | INTRAMUSCULAR | Status: DC | PRN
  Administered 2022-05-23: 4 mg via INTRAVENOUS

## 2022-05-23 MED ORDER — CEFAZOLIN SODIUM-DEXTROSE 2-4 GM/100ML-% IV SOLN
2.0000 g | INTRAVENOUS | Status: AC
  Administered 2022-05-23: 2 g via INTRAVENOUS

## 2022-05-23 MED ORDER — PHENYLEPHRINE 80 MCG/ML (10ML) SYRINGE FOR IV PUSH (FOR BLOOD PRESSURE SUPPORT)
PREFILLED_SYRINGE | INTRAVENOUS | Status: AC
  Filled 2022-05-23: qty 10

## 2022-05-23 MED ORDER — OXYCODONE HCL 5 MG PO TABS
ORAL_TABLET | ORAL | Status: AC
  Filled 2022-05-23: qty 2

## 2022-05-23 MED ORDER — POTASSIUM CHLORIDE CRYS ER 20 MEQ PO TBCR
40.0000 meq | EXTENDED_RELEASE_TABLET | Freq: Two times a day (BID) | ORAL | Status: DC
  Administered 2022-05-23: 40 meq via ORAL
  Filled 2022-05-23: qty 2

## 2022-05-23 MED ORDER — MENTHOL 3 MG MT LOZG
LOZENGE | OROMUCOSAL | Status: AC
  Filled 2022-05-23: qty 9

## 2022-05-23 MED ORDER — LIDOCAINE HCL (PF) 2 % IJ SOLN
INTRAMUSCULAR | Status: AC
  Filled 2022-05-23: qty 5

## 2022-05-23 MED ORDER — HYDROMORPHONE HCL 1 MG/ML IJ SOLN
0.2500 mg | INTRAMUSCULAR | Status: DC | PRN
  Administered 2022-05-23 (×3): 0.25 mg via INTRAVENOUS

## 2022-05-23 MED ORDER — OXYCODONE HCL 5 MG/5ML PO SOLN: 5.0000 mg | Freq: Once | ORAL | Status: DC | PRN

## 2022-05-23 MED ORDER — DEXTROSE IN LACTATED RINGERS 5 % IV SOLN
INTRAVENOUS | Status: DC
Start: 2022-05-23 — End: 2022-05-24

## 2022-05-23 MED ORDER — HYDROCHLOROTHIAZIDE 25 MG PO TABS
25.0000 mg | ORAL_TABLET | Freq: Every day | ORAL | Status: DC
Start: 2022-05-23 — End: 2022-05-24
  Administered 2022-05-23: 25 mg via ORAL
  Filled 2022-05-23: qty 1

## 2022-05-23 MED ORDER — MIDAZOLAM HCL 2 MG/2ML IJ SOLN
INTRAMUSCULAR | Status: DC | PRN
  Administered 2022-05-23: 2 mg via INTRAVENOUS

## 2022-05-23 MED ORDER — SCOPOLAMINE 1 MG/3DAYS TD PT72
MEDICATED_PATCH | TRANSDERMAL | Status: DC | PRN
  Administered 2022-05-23: 1 via TRANSDERMAL

## 2022-05-23 MED ORDER — OXYCODONE HCL 5 MG PO TABS
5.0000 mg | ORAL_TABLET | ORAL | Status: DC | PRN
  Administered 2022-05-23 – 2022-05-24 (×4): 10 mg via ORAL

## 2022-05-23 MED ORDER — PROPOFOL 10 MG/ML IV BOLUS
INTRAVENOUS | Status: DC | PRN
  Administered 2022-05-23: 150 mg via INTRAVENOUS

## 2022-05-23 MED ORDER — LIDOCAINE HCL (PF) 2 % IJ SOLN
INTRAMUSCULAR | Status: DC | PRN
  Administered 2022-05-23: 1.119 mg/kg/h via INTRADERMAL

## 2022-05-23 MED ORDER — ONDANSETRON HCL 4 MG/2ML IJ SOLN
INTRAMUSCULAR | Status: AC
  Filled 2022-05-23: qty 2

## 2022-05-23 MED ORDER — MENTHOL 3 MG MT LOZG
1.0000 | LOZENGE | OROMUCOSAL | Status: DC | PRN
  Administered 2022-05-23: 3 mg via ORAL

## 2022-05-23 MED ORDER — SIMETHICONE 80 MG PO CHEW: 80.0000 mg | CHEWABLE_TABLET | Freq: Four times a day (QID) | ORAL | Status: DC | PRN

## 2022-05-23 MED ORDER — EPHEDRINE 5 MG/ML INJ
INTRAVENOUS | Status: AC
  Filled 2022-05-23: qty 5

## 2022-05-23 MED ORDER — POVIDONE-IODINE 10 % EX SWAB: 2.0000 | Freq: Once | CUTANEOUS | Status: DC

## 2022-05-23 MED ORDER — IBUPROFEN 200 MG PO TABS
600.0000 mg | ORAL_TABLET | Freq: Four times a day (QID) | ORAL | Status: DC
Start: 2022-05-24 — End: 2022-05-24

## 2022-05-23 MED ORDER — HYDROMORPHONE HCL 1 MG/ML IJ SOLN
INTRAMUSCULAR | Status: AC
  Filled 2022-05-23: qty 1

## 2022-05-23 MED ORDER — LACTATED RINGERS IV SOLN: INTRAVENOUS | Status: DC

## 2022-05-23 MED ORDER — PROMETHAZINE HCL 25 MG/ML IJ SOLN: 6.2500 mg | INTRAMUSCULAR | Status: DC | PRN

## 2022-05-23 MED ORDER — PANTOPRAZOLE SODIUM 40 MG PO TBEC
40.0000 mg | DELAYED_RELEASE_TABLET | Freq: Every day | ORAL | Status: DC
  Administered 2022-05-23: 40 mg via ORAL

## 2022-05-23 MED ORDER — DEXMEDETOMIDINE HCL IN NACL 80 MCG/20ML IV SOLN
INTRAVENOUS | Status: AC
  Filled 2022-05-23: qty 20

## 2022-05-23 MED ORDER — DEXAMETHASONE SODIUM PHOSPHATE 10 MG/ML IJ SOLN
INTRAMUSCULAR | Status: AC
  Filled 2022-05-23: qty 1

## 2022-05-23 MED ORDER — ONDANSETRON HCL 4 MG PO TABS: 4.0000 mg | ORAL_TABLET | Freq: Four times a day (QID) | ORAL | Status: DC | PRN

## 2022-05-23 MED ORDER — CEFAZOLIN SODIUM-DEXTROSE 2-4 GM/100ML-% IV SOLN
INTRAVENOUS | Status: AC
  Filled 2022-05-23: qty 100

## 2022-05-23 SURGICAL SUPPLY — 63 items
ADH SKN CLS APL DERMABOND .7 (GAUZE/BANDAGES/DRESSINGS) ×1
APL SRG 38 LTWT LNG FL B (MISCELLANEOUS) ×1
APPLICATOR ARISTA FLEXITIP XL (MISCELLANEOUS) ×1 IMPLANT
BARRIER ADHS 3X4 INTERCEED (GAUZE/BANDAGES/DRESSINGS) IMPLANT
BRR ADH 4X3 ABS CNTRL BYND (GAUZE/BANDAGES/DRESSINGS)
CATH FOLEY 3WAY  5CC 16FR (CATHETERS) ×2
CATH FOLEY 3WAY 5CC 16FR (CATHETERS) ×1 IMPLANT
COVER BACK TABLE 60X90IN (DRAPES) ×2 IMPLANT
COVER TIP SHEARS 8 DVNC (MISCELLANEOUS) ×1 IMPLANT
COVER TIP SHEARS 8MM DA VINCI (MISCELLANEOUS) ×2
DECANTER SPIKE VIAL GLASS SM (MISCELLANEOUS) ×2 IMPLANT
DEFOGGER SCOPE WARMER CLEARIFY (MISCELLANEOUS) ×2 IMPLANT
DERMABOND ADVANCED (GAUZE/BANDAGES/DRESSINGS) ×1
DERMABOND ADVANCED .7 DNX12 (GAUZE/BANDAGES/DRESSINGS) ×1 IMPLANT
DRAPE ARM DVNC X/XI (DISPOSABLE) ×4 IMPLANT
DRAPE COLUMN DVNC XI (DISPOSABLE) ×1 IMPLANT
DRAPE DA VINCI XI ARM (DISPOSABLE) ×8
DRAPE DA VINCI XI COLUMN (DISPOSABLE) ×2
DRAPE UTILITY XL STRL (DRAPES) ×2 IMPLANT
DURAPREP 26ML APPLICATOR (WOUND CARE) ×2 IMPLANT
ELECT REM PT RETURN 9FT ADLT (ELECTROSURGICAL) ×2
ELECTRODE REM PT RTRN 9FT ADLT (ELECTROSURGICAL) ×1 IMPLANT
GAUZE 4X4 16PLY ~~LOC~~+RFID DBL (SPONGE) ×4 IMPLANT
GLOVE BIOGEL PI IND STRL 7.0 (GLOVE) ×5 IMPLANT
GLOVE BIOGEL PI INDICATOR 7.0 (GLOVE) ×5
GLOVE ECLIPSE 6.5 STRL STRAW (GLOVE) ×6 IMPLANT
HEMOSTAT ARISTA ABSORB 3G PWDR (HEMOSTASIS) ×1 IMPLANT
HOLDER FOLEY CATH W/STRAP (MISCELLANEOUS) IMPLANT
IRRIG SUCT STRYKERFLOW 2 WTIP (MISCELLANEOUS) ×2
IRRIGATION SUCT STRKRFLW 2 WTP (MISCELLANEOUS) ×1 IMPLANT
KIT TURNOVER CYSTO (KITS) ×2 IMPLANT
LEGGING LITHOTOMY PAIR STRL (DRAPES) ×2 IMPLANT
NEEDLE INSUFFLATION 120MM (ENDOMECHANICALS) ×2 IMPLANT
OBTURATOR OPTICAL STANDARD 8MM (TROCAR) ×2
OBTURATOR OPTICAL STND 8 DVNC (TROCAR) ×1
OBTURATOR OPTICALSTD 8 DVNC (TROCAR) ×1 IMPLANT
OCCLUDER COLPOPNEUMO (BALLOONS) ×2 IMPLANT
PACK ROBOT WH (CUSTOM PROCEDURE TRAY) ×2 IMPLANT
PACK ROBOTIC GOWN (GOWN DISPOSABLE) ×2 IMPLANT
PACK TRENDGUARD 450 HYBRID PRO (MISCELLANEOUS) ×1 IMPLANT
PAD OB MATERNITY 4.3X12.25 (PERSONAL CARE ITEMS) ×2 IMPLANT
PAD PREP 24X48 CUFFED NSTRL (MISCELLANEOUS) ×2 IMPLANT
PROTECTOR NERVE ULNAR (MISCELLANEOUS) ×4 IMPLANT
SEAL CANN UNIV 5-8 DVNC XI (MISCELLANEOUS) ×4 IMPLANT
SEAL XI 5MM-8MM UNIVERSAL (MISCELLANEOUS) ×8
SEALER VESSEL DA VINCI XI (MISCELLANEOUS) ×4
SEALER VESSEL EXT DVNC XI (MISCELLANEOUS) ×1 IMPLANT
SET IRRIG Y TYPE TUR BLADDER L (SET/KITS/TRAYS/PACK) IMPLANT
SET TRI-LUMEN FLTR TB AIRSEAL (TUBING) ×2 IMPLANT
SPONGE T-LAP 4X18 ~~LOC~~+RFID (SPONGE) ×2 IMPLANT
SUT VIC AB 0 CT1 36 (SUTURE) ×4 IMPLANT
SUT VIC AB 4-0 PS2 18 (SUTURE) ×5 IMPLANT
SUT VLOC 180 0 9IN  GS21 (SUTURE) ×2
SUT VLOC 180 0 9IN GS21 (SUTURE) ×1 IMPLANT
TIP RUMI ORANGE 6.7MMX12CM (TIP) IMPLANT
TIP UTERINE 5.1X6CM LAV DISP (MISCELLANEOUS) IMPLANT
TIP UTERINE 6.7X10CM GRN DISP (MISCELLANEOUS) IMPLANT
TIP UTERINE 6.7X6CM WHT DISP (MISCELLANEOUS) IMPLANT
TIP UTERINE 6.7X8CM BLUE DISP (MISCELLANEOUS) ×1 IMPLANT
TOWEL OR 17X26 10 PK STRL BLUE (TOWEL DISPOSABLE) ×4 IMPLANT
TRENDGUARD 450 HYBRID PRO PACK (MISCELLANEOUS) ×2
TROCAR PORT AIRSEAL 8X120 (TROCAR) ×2 IMPLANT
WATER STERILE IRR 1000ML POUR (IV SOLUTION) ×2 IMPLANT

## 2022-05-23 NOTE — Interval H&P Note (Signed)
History and Physical Interval Note:  05/23/2022 7:37 AM  Amy [name]  has presented today for surgery, with the diagnosis of Menorrhagia, dysmenorrhea, uterine fibroid.  The various methods of treatment have been discussed with the patient and family. After consideration of risks, benefits and other options for treatment, the patient has consented to  Procedure(s): XI ROBOTIC ASSISTED LAPAROSCOPIC HYSTERECTOMY AND SALPINGECTOMY (N/A) as a surgical intervention.  The patient's history has been reviewed, patient examined, no change in status, stable for surgery.  I have reviewed the patient's chart and labs.  Questions were answered to the patient's satisfaction.     Chrisa Hassan A Felicie Kocher

## 2022-05-23 NOTE — Brief Op Note (Signed)
05/23/2022  11:06 AM  PATIENT:  Amy [name]  51 y.o. female  PRE-OPERATIVE DIAGNOSIS:  Menorrhagia, dysmenorrhea, uterine fibroid, chronic HTN  POST-OPERATIVE DIAGNOSIS:  Menorrhagia, dysmenorrhea, uterine fibroid. Chronic HTN, left ovarian cyst  PROCEDURE:  Procedure(s): XI ROBOTIC ASSISTED LAPAROSCOPIC HYSTERECTOMY AND SALPINGECTOMY/LEFT OVARIAN CYSTECTOMY (N/A)  SURGEON:  Surgeon(s) and Role:    * Servando Salina, MD - Primary  PHYSICIAN ASSISTANT:   ASSISTANTS: Charlestine Massed RNFA   ANESTHESIA:   general Findings: left ovarian cyst, nl tubes bilaterally. Nl liver edge, uterus with multiple fibroids. Specimen 343.6 g EBL:  30 mL   BLOOD ADMINISTERED:none  DRAINS: none   LOCAL MEDICATIONS USED:  MARCAINE     SPECIMEN:  Source of Specimen:  uterus with cervix , myoma, tubes , left ovarian cyst wall  DISPOSITION OF SPECIMEN:  PATHOLOGY  COUNTS:  YES  TOURNIQUET:  * No tourniquets in log *  DICTATION: .Other Dictation: Dictation Number [id number]  PLAN OF CARE: Admit for overnight observation  PATIENT DISPOSITION:  PACU - hemodynamically stable.   Delay start of Pharmacological VTE agent (>24hrs) due to surgical blood loss or risk of bleeding: no

## 2022-05-23 NOTE — Anesthesia Preprocedure Evaluation (Signed)
Anesthesia Evaluation  Patient identified by MRN, date of birth, ID band Patient awake    Reviewed: Allergy & Precautions, NPO status , Patient's Chart, lab work & pertinent test results  Airway Mallampati: II  TM Distance: >3 FB Neck ROM: Full    Dental no notable dental hx.    Pulmonary neg pulmonary ROS,    Pulmonary exam normal breath sounds clear to auscultation       Cardiovascular hypertension, Pt. on medications negative cardio ROS Normal cardiovascular exam Rhythm:Regular Rate:Normal     Neuro/Psych Anxiety Depression negative neurological ROS  negative psych ROS   GI/Hepatic negative GI ROS, Neg liver ROS,   Endo/Other  negative endocrine ROSdiabetes  Renal/GU negative Renal ROS  negative genitourinary   Musculoskeletal negative musculoskeletal ROS (+)   Abdominal   Peds negative pediatric ROS (+)  Hematology negative hematology ROS (+)   Anesthesia Other Findings   Reproductive/Obstetrics negative OB ROS                             Anesthesia Physical Anesthesia Plan  ASA: 3  Anesthesia Plan: General   Post-op Pain Management: Dilaudid IV, Ofirmev IV (intra-op)* and Toradol IV (intra-op)*   Induction: Intravenous  PONV Risk Score and Plan: 3 and Ondansetron, Dexamethasone, Midazolam and Treatment may vary due to age or medical condition  Airway Management Planned: Oral ETT  Additional Equipment:   Intra-op Plan:   Post-operative Plan: Extubation in OR  Informed Consent: I have reviewed the patients History and Physical, chart, labs and discussed the procedure including the risks, benefits and alternatives for the proposed anesthesia with the patient or authorized representative who has indicated his/her understanding and acceptance.     Dental advisory given  Plan Discussed with: CRNA  Anesthesia Plan Comments:         Anesthesia Quick Evaluation

## 2022-05-23 NOTE — Anesthesia Procedure Notes (Signed)
Procedure Name: Intubation Date/Time: 05/23/2022 7:50 AM  Performed by: Suan Halter, CRNAPre-anesthesia Checklist: Patient identified, Emergency Drugs available, Suction available and Patient being monitored Patient Re-evaluated:Patient Re-evaluated prior to induction Oxygen Delivery Method: Circle system utilized Preoxygenation: Pre-oxygenation with 100% oxygen Induction Type: IV induction Ventilation: Mask ventilation without difficulty Laryngoscope Size: Mac and 3 Grade View: Grade II Tube type: Oral Tube size: 7.0 mm Number of attempts: 1 Airway Equipment and Method: Stylet and Oral airway Placement Confirmation: ETT inserted through vocal cords under direct vision, positive ETCO2 and breath sounds checked- equal and bilateral Secured at: 22 cm Tube secured with: Tape Dental Injury: Teeth and Oropharynx as per pre-operative assessment

## 2022-05-23 NOTE — Transfer of Care (Signed)
Immediate Anesthesia Transfer of Care Note  Patient: Amy [name]  Procedure(s) Performed: Procedure(s) (LRB): XI ROBOTIC ASSISTED LAPAROSCOPIC HYSTERECTOMY AND SALPINGECTOMY/LEFT OVARIAN CYSTECTOMY (N/A)  Patient Location: PACU  Anesthesia Type: General  Level of Consciousness: awake, oriented, sedated and patient cooperative  Airway & Oxygen Therapy: Patient Spontanous Breathing and Patient connected to face mask oxygen  Post-op Assessment: Report given to PACU RN and Post -op Vital signs reviewed and stable  Post vital signs: Reviewed and stable  Complications: No apparent anesthesia complications Last Vitals:  Vitals Value Taken Time  BP 112/65 05/23/22 1048  Temp    Pulse 79 05/23/22 1053  Resp 15 05/23/22 1053  SpO2 100 % 05/23/22 1053  Vitals shown include unvalidated device data.  Last Pain:  Vitals:   05/23/22 0557  TempSrc: Oral  PainSc: 2       Patients Stated Pain Goal: 3 (18/56/31 4970)  Complications: No notable events documented.

## 2022-05-23 NOTE — Op Note (Unsigned)
NAMETEAGYN, FISHEL MEDICAL RECORD NO: [id number] ACCOUNT NO: [id number] DATE OF BIRTH: 1971/03/18 FACILITY: [facility] LOCATION: [facility] PHYSICIAN: Jakaylee Sasaki A. Garwin Brothers, MD  Operative Report   DATE OF PROCEDURE: 05/23/2022  PREOPERATIVE DIAGNOSES:  Symptomatic uterine fibroids, chronic hypertension.  PROCEDURE:  Da Vinci robotic total hysterectomy, bilateral salpingectomy, left ovarian cystectomy.  POSTOPERATIVE DIAGNOSES:  Symptomatic uterine fibroids, chronic hypertension, left ovarian cyst.  ANESTHESIA:  General.  SURGEON:  Michelle Wnek A. Garwin Brothers, MD.  ASSISTANT:  Gaylord Shih, RNFA.  DESCRIPTION OF PROCEDURE:  Under adequate general anesthesia, the patient was positioned for robotic surgery.  She was placed in the dorsal lithotomy position.  Examination under anesthesia revealed a retroverted uterus with a large posterior palpable  fibroid and a uterus about 13-week size. The patient was sterilely prepped and draped in the usual fashion.  A 3-way Foley catheter was sterilely placed, draining Pyridium-stained urine.  Bivalve speculum was placed in the vagina.  A single-tooth  tenaculum was placed on the anterior lip of the cervix and a 0 Vicryl figure-of-eight was placed on the posterior lip of the cervix.  The uterus sounded to 4 inches.  A #8 uterine manipulator with a small Koh RUMI cup was introduced into the uterus  without incident and the retractor was removed.  Attention was then turned to the abdomen, 0.25% Marcaine was injected supraumbilically.  Supraumbilical incision was then made vertically.  Veress needle was introduced and tested with sterile water with a  positive drop test.  The opening pressure was 2 and 2.6 liters of CO2 was insufflated.  Veress needle was then removed.  An 8 mm trocar was introduced into the abdomen.  A lighted robotic camera laparoscope was then introduced into the port confirming  entry into the abdomen without incident.  The patient was placed  in the deep Trendelenburg position.  Panoramic inspection showed normal liver edge, appendix was not seen. A large fibroid uterus was encountered and with a large posterior right subserosal  fibroid in the lower uterine segment was noted. With the size of the uterus, the right ovary and tube were not initially seen.  The left tube was noted to be elongated.  Two robotic port sites was placed 9 cm lateral from the camera site, two on the  right also about 8-9 cm apart. Superior in the midline on the left was placement of the 8 mm AirSeal. So, under direct visualization, all of the ports were then placed.  Once that was done, the robot was docked and in arm #1, vessel sealer was placed and  in arm 3, the long bipolar cautery. In arm #4, the monopolar scissors.  I then went to the surgical console.  At the surgical console, panoramic inspection of the pelvis was then performed.  Specifically, there was about probably a 4 cm, clear-appearing  cystic ovary, elongated left tube, right tube was normal, right ovary was normal.  Both ureters were seen peristalsing in the field.  The left was deeper. Using the vessel sealer, the procedure was started on the left.  The left mesosalpinx was serially  clamped, cauterized and cut until the fallopian tube was removed.  The retroperitoneal space on the left was then opened.  A window was placed in the medial leaf of the broad ligament. Serial clamping of the left utero-ovarian ligament was then  performed with the ovary being detached. The left broad ligament posterior leaf was then further dissected and displaced inferiorly while the uterus was displaced anteriorly and up,  went to the right and the right fallopian tube was grasped, mesosalpinx  was serially clamped, cauterized and cut and  the fallopian tube removed.  The right retroperitoneal space was then opened and the right utero-ovarian ligaments were then serially clamped, cauterized and cut.  The round ligament on  the right was then  clamped, cauterized and cut and the vesicouterine peritoneum was then opened anteriorly and carried around to the contralateral side.  The right uterine vessels were then skeletonized. Right uterine vessels were serially clamped, cauterized and then cut.   The left uterine vessels were then identified.  The vesicouterine peritoneum was again opened further and completely opened anteriorly with the bladder blade and sharply dissected off the lower uterine segment and the ovary off of the RUMI cup and  displaced inferiorly.  The left uterine vessels were then serially clamped, cauterized and cut.  The cervicovaginal junction was then opened with cautery and circumferentially the cervix was detached from the vagina.  The uterus was too big to be removed  through the vagina; therefore, the following instruments were then placed, the robotic tenaculum and the robotic hook was used to replace the vessel sealer and the monopolar scissors respectively.  At that point, the largest fibroid, which was the right  posterior lower uterine segment was grasped with a tenaculum. The hook was used to make an incision overlying the fibroid and the fibroid was partially enucleated from its base.  Another fibroid was also removed entirely and with that, the uterus was  then able to be removed vaginally.  Once that was done, the insufflator was reinserted.  The vaginal cuff was cauterized for small bleeders.  The vaginal cuff was then closed with removal of the tenaculum and the hook.  They were also then replaced with  a long tip forceps, and the large mega needle suture driver. 0 V-Loc suture was then used to close in a running 2-layer closure of the vaginal cuff.  Attention was then turned to the left ovarian cyst.  The suture needle driver was removed and replaced  again with the scissors.  The left ovary was grasped.  Cautery was then used to open the serosal surface and incidental rupture occurred of the  cyst. Clear fluid was noted.  The cyst wall was removed, the base cauterized.  The pelvis was irrigated and  suctioned.  Both ureters were noted to be peristalsing very well and the pedicles were hemostased.  The pressure was dropped down to 8.  The vaginal cuff was digitally palpated intraoperatively.  Irrigation and suction.  Good hemostasis noted.  The  Arista potato starch was then placed over the vaginal cuff.  The procedure was felt to be completed. At that point, the robot was undocked.  I then went back sterilely to the patient's bedside, and under direct visualization, the ports were removed.  The  abdomen was deflated.  The AirSeal was then removed.  The port sites were then closed with 4-0 Vicryl subcuticular closure after hemostasis was achieved with cautery.  I then inspected the vaginal cuff that also was well approximated.  SPECIMEN:  The uterus, the myoma, cervix, fallopian tube and left ovarian cyst wall all sent to pathology.  INTRAOPERATIVE FLUIDS:  800  URINE OUTPUT:  200  ESTIMATED BLOOD LOSS:  25 mL  COUNTS:  Sponge and instrument counts x2 was correct.  COMPLICATIONS:  None.  The patient tolerated the procedure well and was transferred to recovery room in stable condition.  PAA D: 05/23/2022 10:55:44 am T: 05/23/2022 1:04:00 pm  JOB: [id number]/ [id number]

## 2022-05-23 NOTE — H&P (Signed)
Amy [name] is an 51 y.o. female. G47P2 DBF presents for surgical mgmt of symptomatic uterine fibroids. Pt is here for Davinci robotic total hysterectomy, bilateral salpingectomy. Pt has been on progesterone pills with escalation of sx including cramping, bleeding  Pertinent Gynecological History: Menses: flow is excessive with use of 5 pads or tampons on heaviest days Bleeding: menorrhagia Contraception: oral progesterone-only contraceptive DES exposure: denies Blood transfusions: none Sexually transmitted diseases: past history: hsv Previous GYN Procedures:  n/a   Last mammogram: normal Date: 2023 Last pap: normal Date: 2023 OB History: G2, P2   Menstrual History: Menarche age: n/a Patient's last menstrual period was 05/19/2022 (exact date).    Past Medical History:  Diagnosis Date   Anxiety    doing better per pt on 6/81/15   Complication of anesthesia    nausea and vomiting w/anesthesia   Depression    doing better per pt on 05/08/22, no meds   Family history of colon cancer    as of 05/08/22, last colonoscopy 03/21/2020   Gestational diabetes 2007   resolved after pregnancy   Hyperlipidemia    no meds as of 05/08/22   Hypertension    As of 05/08/22, pt is taking Amlodipine and HCTZ.   Thyroid disease 2009   FNA of thyroid on 12/07/07 followed by partial throidectomy, pt states that she had a tennis ball size benign tumor removed, no problems since   Wears glasses     Past Surgical History:  Procedure Laterality Date   COLONOSCOPY  03/21/2020   ascending colon polyp - tubular adenoma   THYROIDECTOMY, PARTIAL  2009   benign tumor removed    Family History  Problem Relation Age of Onset   Colon polyps Mother    Diverticulitis Mother    Berenice Primas' disease Mother    Colon cancer Father    Lung cancer Father    Diabetes Father    Colon cancer Paternal Grandfather    Colon cancer Paternal Uncle    Liver disease Paternal Uncle    Kidney cancer Maternal Uncle     Heart disease Paternal Grandmother    Esophageal cancer Neg Hx    Stomach cancer Neg Hx    Rectal cancer Neg Hx     Social History:  reports that she has never smoked. She has never used smokeless tobacco. She reports current alcohol use. She reports that she does not use drugs.  Allergies:  Allergies  Allergen Reactions   Latex Rash    Medications Prior to Admission  Medication Sig Dispense Refill Last Dose   amLODipine (NORVASC) 5 MG tablet Take 5 mg by mouth daily.   05/23/2022 at 0445   Cholecalciferol (VITAMIN D3) 25 MCG (1000 UT) CAPS Take by mouth daily. Monday thru Friday.   05/19/2022   hydrochlorothiazide (HYDRODIURIL) 25 MG tablet Take 25 mg by mouth daily.   05/22/2022   ibuprofen (ADVIL) 600 MG tablet Take 600 mg by mouth as needed for cramping.   05/21/2022   norethindrone (MICRONOR,CAMILA,ERRIN) 0.35 MG tablet Take 1 tablet by mouth daily.   05/22/2022   vitamin B-12 (CYANOCOBALAMIN) 100 MCG tablet Take 100 mcg by mouth daily. Pt unsure of dosage as of 05/08/22.   05/19/2022   valACYclovir (VALTREX) 500 MG tablet Take 500 mg by mouth as needed.   More than a month    Review of Systems  All other systems reviewed and are negative.   Blood pressure 128/82, pulse 73, temperature 98.5 F (36.9 C), temperature  source Oral, resp. rate 16, height '5\' 3"'$  (1.6 m), weight 69.7 kg, last menstrual period 05/19/2022, SpO2 97 %. Physical Exam Constitutional:      Appearance: Normal appearance.  HENT:     Head: Atraumatic.  Eyes:     Extraocular Movements: Extraocular movements intact.  Cardiovascular:     Rate and Rhythm: Regular rhythm.     Heart sounds: Normal heart sounds.  Pulmonary:     Breath sounds: Normal breath sounds.  Abdominal:     Palpations: Abdomen is soft.  Genitourinary:    General: Normal vulva.     Comments: Vagina: nl  Cervix parous ant Uterus RF/RV irregular 11 wk Adnexa nl Musculoskeletal:        General: Normal range of motion.     Cervical back:  Neck supple.  Skin:    General: Skin is warm and dry.  Neurological:     General: No focal deficit present.     Mental Status: She is alert and oriented to person, place, and time.  Psychiatric:        Mood and Affect: Mood normal.        Behavior: Behavior normal.     Results for orders placed or performed during the hospital encounter of 05/23/22 (from the past 24 hour(s))  Pregnancy, urine POC     Status: None   Collection Time: 05/23/22  5:29 AM  Result Value Ref Range   Preg Test, Ur NEGATIVE NEGATIVE    No results found.  Assessment/Plan: Synotomatic uterine fibroids Chronic HTN P) DaVinci robotic total hysterectomy, bilateral salpingectomy. Risk of surgery reviewed including infection, bleeding, injury to bladder, bowel, ureter. Internal scar tissue, possible need for blood transfusion and its risk( hiv, hepatitis, acute rxn). Possible need for surgery in the future due to ovarian disease including cancer, ovarian cyst, pain. Internal scar tissue, possible need to convert to open procedure. All ? answered  Michail Boyte A Stepehn Eckard 05/23/2022, 6:20 AM

## 2022-05-23 NOTE — Anesthesia Postprocedure Evaluation (Signed)
Anesthesia Post Note  Patient: Amy [name]  Procedure(s) Performed: XI ROBOTIC ASSISTED LAPAROSCOPIC HYSTERECTOMY AND SALPINGECTOMY/LEFT OVARIAN CYSTECTOMY (Pelvis)     Patient location during evaluation: PACU Anesthesia Type: General Level of consciousness: awake and alert Pain management: pain level controlled Vital Signs Assessment: post-procedure vital signs reviewed and stable Respiratory status: spontaneous breathing, nonlabored ventilation and respiratory function stable Cardiovascular status: blood pressure returned to baseline and stable Postop Assessment: no apparent nausea or vomiting Anesthetic complications: no   No notable events documented.  Last Vitals:  Vitals:   05/23/22 1200 05/23/22 1215  BP: 97/63 103/64  Pulse: 64 72  Resp:  16  Temp:  (!) 36.4 C  SpO2: 100% 100%    Last Pain:  Vitals:   05/23/22 1215  TempSrc:   PainSc: 2                  Lynda Rainwater

## 2022-05-24 DIAGNOSIS — D252 Subserosal leiomyoma of uterus: Secondary | ICD-10-CM | POA: Diagnosis not present

## 2022-05-24 LAB — CBC
HCT: 38.4 % (ref 36.0–46.0)
Hemoglobin: 13.2 g/dL (ref 12.0–15.0)
MCH: 31.3 pg (ref 26.0–34.0)
MCHC: 34.4 g/dL (ref 30.0–36.0)
MCV: 91 fL (ref 80.0–100.0)
Platelets: 157 10*3/uL (ref 150–400)
RBC: 4.22 MIL/uL (ref 3.87–5.11)
RDW: 12.9 % (ref 11.5–15.5)
WBC: 11.1 10*3/uL — ABNORMAL HIGH (ref 4.0–10.5)
nRBC: 0 % (ref 0.0–0.2)

## 2022-05-24 LAB — BASIC METABOLIC PANEL
Anion gap: 7 (ref 5–15)
BUN: 12 mg/dL (ref 6–20)
CO2: 26 mmol/L (ref 22–32)
Calcium: 8.9 mg/dL (ref 8.9–10.3)
Chloride: 105 mmol/L (ref 98–111)
Creatinine, Ser: 0.81 mg/dL (ref 0.44–1.00)
GFR, Estimated: >60 mL/min (ref >60)
Glucose, Bld: 145 mg/dL — ABNORMAL HIGH (ref 70–99)
Potassium: 4 mmol/L (ref 3.5–5.1)
Sodium: 138 mmol/L (ref 135–145)

## 2022-05-24 MED ORDER — OXYCODONE HCL 5 MG PO TABS
ORAL_TABLET | ORAL | Status: AC
  Filled 2022-05-24: qty 2

## 2022-05-24 MED ORDER — KETOROLAC TROMETHAMINE 30 MG/ML IJ SOLN
INTRAMUSCULAR | Status: AC
  Filled 2022-05-24: qty 1

## 2022-05-24 MED ORDER — IBUPROFEN 600 MG PO TABS: 600.0000 mg | ORAL_TABLET | Freq: Four times a day (QID) | ORAL | 11 refills | Status: AC | PRN

## 2022-05-24 MED ORDER — OXYCODONE HCL 5 MG PO TABS
5.0000 mg | ORAL_TABLET | Freq: Four times a day (QID) | ORAL | 0 refills | Status: AC | PRN
Start: 2022-05-24 — End: 2022-05-29

## 2022-05-24 NOTE — Discharge Summary (Signed)
Physician Discharge Summary  Patient ID: Amy [name] MRN: [id number] DOB/AGE: Aug 30, 1971 51 y.o.  Admit date: 05/23/2022 Discharge date: 05/24/2022  Admission Diagnoses:  Discharge Diagnoses:  Principal Problem:   Menorrhagia Active Problems:   S/P hysterectomy   Discharged Condition: {condition:18240}  Hospital Course: ***  Consults: {consultation:18241}  Significant Diagnostic Studies: {diagnostics:18242}  Treatments: {Tx:18249}  Discharge Exam: Blood pressure 114/68, pulse 61, temperature 98.5 F (36.9 C), resp. rate 14, height '5\' 3"'$  (1.6 m), weight 69.7 kg, last menstrual period 05/19/2022, SpO2 98 %. {physical WKGS:8110315}  Disposition: Discharge disposition: 01-Home or Self Care       Discharge Instructions     Call MD for:  persistant dizziness or light-headedness   Complete by: As directed    Call MD for:  severe uncontrolled pain   Complete by: As directed    Call MD for:  temperature >100.4   Complete by: As directed    Diet - low sodium heart healthy   Complete by: As directed    Increase activity slowly   Complete by: As directed    No wound care   Complete by: As directed       Allergies as of 05/24/2022       Reactions   Latex Rash        Medication List     TAKE these medications    amLODipine 5 MG tablet Commonly known as: NORVASC Take 5 mg by mouth daily.   hydrochlorothiazide 25 MG tablet Commonly known as: HYDRODIURIL Take 25 mg by mouth daily.   ibuprofen 600 MG tablet Commonly known as: ADVIL Take 600 mg by mouth as needed for cramping. What changed: Another medication with the same name was added. Make sure you understand how and when to take each.   ibuprofen 600 MG tablet Commonly known as: ADVIL Take 1 tablet (600 mg total) by mouth every 6 (six) hours as needed. What changed: You were already taking a medication with the same name, and this prescription was added. Make sure you understand how and when to  take each.   oxyCODONE 5 MG immediate release tablet Commonly known as: Oxy IR/ROXICODONE Take 1 tablet (5 mg total) by mouth every 6 (six) hours as needed for up to 5 days for moderate pain or severe pain.   valACYclovir 500 MG tablet Commonly known as: VALTREX Take 500 mg by mouth as needed.   vitamin B-12 100 MCG tablet Commonly known as: CYANOCOBALAMIN Take 100 mcg by mouth daily. Pt unsure of dosage as of 05/08/22.   Vitamin D3 25 MCG (1000 UT) Caps Take by mouth daily. Monday thru Friday.        Follow-up Information     Servando Salina, MD Follow up in 2 week(s).   Specialty: Obstetrics and Gynecology Contact information: [address] [place] [address] [phone]                 Signed: Marvene Staff 05/24/2022, 8:43 AM

## 2022-05-24 NOTE — Progress Notes (Signed)
Subjective: Patient reports {sub:3041132}.    Objective: I have reviewed patient's vital signs.  {reviewed:3041135}. Vitals:   05/24/22 0530 05/24/22 0829  BP: (!) 96/54 114/68  Pulse: (!) 50 61  Resp: 14 14  Temp: 98.3 F (36.8 C) 98.5 F (36.9 C)  SpO2: 97% 98%   I/O last 3 completed shifts: In: 2723.8 [P.O.:780; I.V.:1743.8; IV Piggyback:200] Out: 2805 [Urine:2775; Blood:30] Total I/O In: -  Out: 600 [Urine:600]  Lab Results  Component Value Date   WBC 11.1 (H) 05/24/2022   HGB 13.2 05/24/2022   HCT 38.4 05/24/2022   MCV 91.0 05/24/2022   PLT 157 05/24/2022   Lab Results  Component Value Date   CREATININE 0.81 05/24/2022    EXAM General: {Exam; general:16600} Resp: {Exam; lung:16931} Cardio: {Exam; heart:5510} GI: {Exam, JQ:4920100} Extremities: {Exam; extremity:5109} Vaginal Bleeding: {exam; vaginal bleeding:3041122}  Assessment: s/p Procedure(s): XI ROBOTIC ASSISTED LAPAROSCOPIC HYSTERECTOMY AND SALPINGECTOMY/LEFT OVARIAN CYSTECTOMY: {assessment details:3041134}  Plan: {FHQR:9758832}  LOS: 0 days    Marvene Staff, MD 05/24/2022 8:31 AM    05/24/2022, 8:31 AM

## 2022-05-26 ENCOUNTER — Encounter (HOSPITAL_BASED_OUTPATIENT_CLINIC_OR_DEPARTMENT_OTHER): Payer: Self-pay | Admitting: Obstetrics and Gynecology

## 2022-05-26 LAB — SURGICAL PATHOLOGY

## 2023-02-18 ENCOUNTER — Ambulatory Visit (INDEPENDENT_AMBULATORY_CARE_PROVIDER_SITE_OTHER): Payer: Federal, State, Local not specified - PPO

## 2023-02-18 ENCOUNTER — Ambulatory Visit
Admission: EM | Admit: 2023-02-18 | Discharge: 2023-02-18 | Disposition: A | Discharge status: Home / Self Care | Payer: Federal, State, Local not specified - PPO | Attending: Family Medicine | Admitting: Family Medicine

## 2023-02-18 ENCOUNTER — Encounter: Payer: Self-pay | Admitting: Emergency Medicine

## 2023-02-18 DIAGNOSIS — R1030 Lower abdominal pain, unspecified: Secondary | ICD-10-CM | POA: Diagnosis not present

## 2023-02-18 DIAGNOSIS — R102 Pelvic and perineal pain: Secondary | ICD-10-CM

## 2023-02-18 LAB — POCT URINALYSIS DIP (MANUAL ENTRY)
Bilirubin, UA: NEGATIVE
Blood, UA: NEGATIVE
Glucose, UA: NEGATIVE mg/dL
Ketones, POC UA: NEGATIVE mg/dL
Leukocytes, UA: NEGATIVE
Nitrite, UA: NEGATIVE
Protein Ur, POC: NEGATIVE mg/dL
Spec Grav, UA: 1.015 (ref 1.010–1.025)
Urobilinogen, UA: 0.2 E.U./dL
pH, UA: 7 (ref 5.0–8.0)

## 2023-02-18 NOTE — ED Triage Notes (Addendum)
Pt states yesterday she developed low abdominal/pelvic pain that is getting worse. States pain is radiating to back and feels like a dull ache. She is taking aleve with some relief. Denies urinary or GI symptoms. She has had a hysterectomy.

## 2023-02-18 NOTE — ED Provider Notes (Signed)
[name]    CSN: [id number] Arrival date & time: 02/18/23  0801      History   Chief Complaint Chief Complaint  Patient presents with   Abdominal Pain    HPI [name] is a 52 y.o. female.   HPI Pleasant 52 year old female presents with abdominal/pelvic pain that is getting worse.  Radiates to back like a dull ache.  PMH significant for HTN, HLD, and anxiety.  Past Medical History:  Diagnosis Date   Anxiety    doing better per pt on 05/08/22   Complication of anesthesia    nausea and vomiting w/anesthesia   Depression    doing better per pt on 05/08/22, no meds   Family history of colon cancer    as of 05/08/22, last colonoscopy 03/21/2020   Gestational diabetes 2007   resolved after pregnancy   Hyperlipidemia    no meds as of 05/08/22   Hypertension    As of 05/08/22, pt is taking Amlodipine and HCTZ.   Thyroid disease 2009   FNA of thyroid on 12/07/07 followed by partial throidectomy, pt states that she had a tennis ball size benign tumor removed, no problems since   Wears glasses     Patient Active Problem List   Diagnosis Date Noted   Menorrhagia 05/23/2022   S/P hysterectomy 05/23/2022    Past Surgical History:  Procedure Laterality Date   COLONOSCOPY  03/21/2020   ascending colon polyp - tubular adenoma   ROBOTIC ASSISTED LAPAROSCOPIC HYSTERECTOMY AND SALPINGECTOMY N/A 05/23/2022   Procedure: XI ROBOTIC ASSISTED LAPAROSCOPIC HYSTERECTOMY AND SALPINGECTOMY/LEFT OVARIAN CYSTECTOMY;  Surgeon: Maxie Betterousins, Sheronette, MD;  Location: [facility];  Service: Gynecology;  Laterality: N/A;   THYROIDECTOMY, PARTIAL  2009   benign tumor removed    OB History   No obstetric history on file.      Home Medications    Prior to Admission medications   Medication Sig Start Date End Date Taking? Authorizing Provider  amLODipine (NORVASC) 5 MG tablet Take 5 mg by mouth daily.    [provider]  Cholecalciferol (VITAMIN D3) 25  MCG (1000 UT) CAPS Take by mouth daily. Monday thru Friday.    [provider]  hydrochlorothiazide (HYDRODIURIL) 25 MG tablet Take 25 mg by mouth daily.    [provider]  ibuprofen (ADVIL) 600 MG tablet Take 600 mg by mouth as needed for cramping.    [provider]  ibuprofen (ADVIL) 600 MG tablet Take 1 tablet (600 mg total) by mouth every 6 (six) hours as needed. 05/24/22   Maxie Betterousins, Sheronette, MD  valACYclovir (VALTREX) 500 MG tablet Take 500 mg by mouth as needed. 12/05/19   [provider]  vitamin B-12 (CYANOCOBALAMIN) 100 MCG tablet Take 100 mcg by mouth daily. Pt unsure of dosage as of 05/08/22.    [provider]    Family History Family History  Problem Relation Age of Onset   Colon polyps Mother    Diverticulitis Mother    Luiz BlareGraves' disease Mother    Colon cancer Father    Lung cancer Father    Diabetes Father    Colon cancer Paternal Grandfather    Colon cancer Paternal Uncle    Liver disease Paternal Uncle    Kidney cancer Maternal Uncle    Heart disease Paternal Grandmother    Esophageal cancer Neg Hx    Stomach cancer Neg Hx    Rectal cancer Neg Hx     Social History  Social History   Tobacco Use   Smoking status: Never   Smokeless tobacco: Never  Vaping Use   Vaping Use: Never used  Substance Use Topics   Alcohol use: Yes    Comment: about 4 cocktails per weekend per pt on 05/08/22   Drug use: Never     Allergies   Latex   Review of Systems Review of Systems  Gastrointestinal:  Positive for abdominal pain.  All other systems reviewed and are negative.    Physical Exam Triage Vital Signs ED Triage Vitals  Enc Vitals Group     BP 02/18/23 0811 (!) 145/92     Pulse Rate 02/18/23 0811 80     Resp 02/18/23 0811 18     Temp 02/18/23 0811 98.1 F (36.7 C)     Temp Source 02/18/23 0811 Oral     SpO2 02/18/23 0811 98 %     Weight --      Height --      Head Circumference --      Peak Flow --      Pain  Score 02/18/23 0814 4     Pain Loc --      Pain Edu? --      Excl. in GC? --    No data found.  Updated Vital Signs BP (!) 145/92 (BP Location: Right Arm)   Pulse 80   Temp 98.1 F (36.7 C) (Oral)   Resp 18   LMP 05/19/2022 (Exact Date)   SpO2 98%      Physical Exam Vitals and nursing note reviewed.  Constitutional:      Appearance: She is well-developed and normal weight.  HENT:     Head: Normocephalic and atraumatic.     Mouth/Throat:     Mouth: Mucous membranes are moist.     Pharynx: Oropharynx is clear.  Eyes:     Extraocular Movements: Extraocular movements intact.     Pupils: Pupils are equal, round, and reactive to light.  Cardiovascular:     Rate and Rhythm: Normal rate and regular rhythm.     Heart sounds: Normal heart sounds.  Pulmonary:     Effort: Pulmonary effort is normal.     Breath sounds: Normal breath sounds. No wheezing, rhonchi or rales.  Abdominal:     General: Abdomen is flat. Bowel sounds are normal. There is no distension or abdominal bruit. There are no signs of injury.     Palpations: Abdomen is soft. There is no shifting dullness, fluid wave, hepatomegaly, splenomegaly, mass or pulsatile mass.     Tenderness: There is abdominal tenderness in the suprapubic area. There is no right CVA tenderness, left CVA tenderness, guarding or rebound. Negative signs include Murphy's sign, McBurney's sign and psoas sign.     Hernia: No hernia is present.  Skin:    General: Skin is warm and dry.  Neurological:     General: No focal deficit present.     Mental Status: She is alert and oriented to person, place, and time.  Psychiatric:        Mood and Affect: Mood normal.        Behavior: Behavior normal.      UC Treatments / Results  Labs (all labs ordered are listed, but only abnormal results are displayed) Labs Reviewed  POCT URINALYSIS DIP (MANUAL ENTRY)    EKG   Radiology DG Abdomen 1 View  Result Date: 02/18/2023 CLINICAL DATA:  One day  history of worsening lower abdominal/pelvic  pain EXAM: ABDOMEN - 1 VIEW COMPARISON:  None Available. FINDINGS: Nonobstructive bowel gas pattern. No free air or pneumatosis. No abnormal radio-opaque calculi or mass effect. No acute or substantial osseous abnormality. The sacrum and coccyx are partially obscured by overlying bowel contents. Partially imaged lung bases are clear. IMPRESSION: Nonobstructive bowel gas pattern. Electronically Signed   By: Agustin Cree M.D.   On: 02/18/2023 09:04    Procedures Procedures (including critical care time)  Medications Ordered in UC Medications - No data to display  Initial Impression / Assessment and Plan / UC Course  I have reviewed the triage vital signs and the nursing notes.  Pertinent labs & imaging results that were available during my care of the patient were reviewed by me and considered in my medical decision making (see chart for details).     MDM: 1.  Lower abdominal pain-KUB revealed above. Advised patient of abdominal x-ray results with hardcopy and images provided to patient.  Advised if symptoms worsen and/or unresolved please follow-up with local emergency department if abdominal pain is acute or PCP or here for further evaluation.  Patient discharged home, hemodynamically stable. Final Clinical Impressions(s) / UC Diagnoses   Final diagnoses:  Lower abdominal pain     Discharge Instructions      Advised patient of abdominal x-ray results with hardcopy and images provided to patient.  Advised if symptoms worsen and/or unresolved please follow-up with local emergency department if abdominal pain is acute or PCP or here for further evaluation.     ED Prescriptions   None    PDMP not reviewed this encounter.   Trevor Iha, FNP 02/18/23 1007

## 2023-02-18 NOTE — Discharge Instructions (Addendum)
Advised patient of abdominal x-ray results with hardcopy and images provided to patient.  Advised if symptoms worsen and/or unresolved please follow-up with local emergency department if abdominal pain is acute or PCP or here for further evaluation.
